# Patient Record
Sex: Male | Born: 1935 | Race: White | Hispanic: No | Smoking: Former smoker
Health system: Southern US, Community
[De-identification: ages and names within clinical notes are randomized; demographics above are authoritative.]

## PROBLEM LIST (undated history)

## (undated) DIAGNOSIS — R972 Elevated prostate specific antigen [PSA]: Secondary | ICD-10-CM

## (undated) DIAGNOSIS — N390 Urinary tract infection, site not specified: Secondary | ICD-10-CM

## (undated) DIAGNOSIS — N419 Inflammatory disease of prostate, unspecified: Secondary | ICD-10-CM

## (undated) DIAGNOSIS — N400 Enlarged prostate without lower urinary tract symptoms: Secondary | ICD-10-CM

## (undated) DIAGNOSIS — I10 Essential (primary) hypertension: Secondary | ICD-10-CM

## (undated) HISTORY — DX: Benign prostatic hyperplasia without lower urinary tract symptoms: N40.0

## (undated) HISTORY — DX: Essential (primary) hypertension: I10

## (undated) HISTORY — DX: Elevated prostate specific antigen (PSA): R97.20

## (undated) HISTORY — DX: Urinary tract infection, site not specified: N39.0

## (undated) HISTORY — DX: Inflammatory disease of prostate, unspecified: N41.9

---

## 2001-01-13 HISTORY — PX: OTHER SURGICAL HISTORY: SHX169

## 2005-11-07 ENCOUNTER — Ambulatory Visit: Payer: Self-pay | Admitting: Ophthalmology

## 2005-11-12 ENCOUNTER — Ambulatory Visit: Payer: Self-pay | Admitting: Ophthalmology

## 2010-06-22 ENCOUNTER — Observation Stay: Payer: Self-pay | Admitting: Internal Medicine

## 2012-09-30 ENCOUNTER — Ambulatory Visit: Payer: Self-pay | Admitting: Family Medicine

## 2013-12-07 DIAGNOSIS — I471 Supraventricular tachycardia: Secondary | ICD-10-CM | POA: Insufficient documentation

## 2013-12-07 DIAGNOSIS — R739 Hyperglycemia, unspecified: Secondary | ICD-10-CM | POA: Insufficient documentation

## 2013-12-07 DIAGNOSIS — I38 Endocarditis, valve unspecified: Secondary | ICD-10-CM | POA: Insufficient documentation

## 2014-05-10 ENCOUNTER — Ambulatory Visit: Payer: Self-pay | Admitting: Family Medicine

## 2015-01-04 DIAGNOSIS — I10 Essential (primary) hypertension: Secondary | ICD-10-CM | POA: Insufficient documentation

## 2015-05-26 ENCOUNTER — Encounter: Payer: Self-pay | Admitting: *Deleted

## 2015-06-14 ENCOUNTER — Ambulatory Visit (INDEPENDENT_AMBULATORY_CARE_PROVIDER_SITE_OTHER): Payer: Medicare Other | Admitting: Urology

## 2015-06-14 ENCOUNTER — Encounter: Payer: Self-pay | Admitting: Urology

## 2015-06-14 VITALS — BP 155/66 | HR 64 | Ht 67.0 in | Wt 166.2 lb

## 2015-06-14 DIAGNOSIS — N401 Enlarged prostate with lower urinary tract symptoms: Secondary | ICD-10-CM | POA: Diagnosis not present

## 2015-06-14 DIAGNOSIS — N138 Other obstructive and reflux uropathy: Secondary | ICD-10-CM | POA: Insufficient documentation

## 2015-06-14 DIAGNOSIS — N62 Hypertrophy of breast: Secondary | ICD-10-CM

## 2015-06-14 MED ORDER — DUTASTERIDE-TAMSULOSIN HCL 0.5-0.4 MG PO CAPS: 1.0000 | ORAL_CAPSULE | Freq: Every day | ORAL | Status: DC

## 2015-06-14 NOTE — Progress Notes (Signed)
06/14/2015 12:06 PM   Mario Aguilar Jan 18, 1914 [id number]  Referring provider: No referring provider defined for this encounter.  Chief Complaint  Patient presents with  . Benign Prostatic Hypertrophy    6 mth follow up    HPI: Patient is a 81 year old white male with BPH and LUTS who presents today for 6 month follow-up.  BPH WITH LUTS His IPSS score today is 4, which is mild lower urinary tract symptomatology. He is delighted with his quality life due to his urinary symptoms. His major complaint today having to take medication.   He denies any dysuria, hematuria or suprapubic pain.  He currently taking Jalyn, but he would like to discontinue the medication.  His has had TARGIS in 2002.  He also denies any recent fevers, chills, nausea or vomiting.  He does not have a family history of PCa.  He is experiencing breast tissue growth bilaterally and breast tenderness.       IPSS      06/14/15 1100       International Prostate Symptom Score   How often have you had the sensation of not emptying your bladder? Not at All     How often have you had to urinate less than every two hours? Not at All     How often have you found you stopped and started again several times when you urinated? Not at All     How often have you found it difficult to postpone urination? Less than half the time     How often have you had a weak urinary stream? Not at All     How often have you had to strain to start urination? Less than half the time     How many times did you typically get up at night to urinate? None     Total IPSS Score 4     Quality of Life due to urinary symptoms   If you were to spend the rest of your life with your urinary condition just the way it is now how would you feel about that? Delighted        Score:  1-7 Mild 8-19 Moderate 20-35 Severe  PMH: Past Medical History  Diagnosis Date  . Prostatitis   . BPH (benign prostatic hypertrophy)   . HTN (hypertension)   .  Elevated PSA   . Recurrent UTI     Surgical History: Past Surgical History  Procedure Laterality Date  . Targis  01/13/2001    Home Medications:    Medication List       This list is accurate as of: 06/14/15 12:06 PM.  Always use your most recent med list.               aspirin EC 81 MG tablet  Take 81 mg by mouth daily.     calcium-vitamin D 500-200 MG-UNIT per tablet  Commonly known as:  OSCAL WITH D  Take 1 tablet by mouth.     metoprolol succinate 25 MG 24 hr tablet  Commonly known as:  TOPROL-XL  Take 25 mg by mouth daily.        Allergies:  Allergies  Allergen Reactions  . Amoxicillin Itching  . Lisinopril Cough    Family History: Family History  Problem Relation Age of Onset  . Prostate cancer Neg Hx   . Bladder Cancer Neg Hx   . Kidney cancer Neg Hx     Social History:  reports that he has  never smoked. He does not have any smokeless tobacco history on file. He reports that he does not drink alcohol or use illicit drugs.  ROS: UROLOGY Frequent Urination?: No Hard to postpone urination?: No Burning/pain with urination?: No Get up at night to urinate?: No Leakage of urine?: No Urine stream starts and stops?: No Trouble starting stream?: No Do you have to strain to urinate?: No Blood in urine?: No Urinary tract infection?: No Sexually transmitted disease?: No Injury to kidneys or bladder?: No Painful intercourse?: No Weak stream?: No Erection problems?: No Penile pain?: No  Gastrointestinal Nausea?: No Vomiting?: No Indigestion/heartburn?: No Diarrhea?: No Constipation?: No  Constitutional Fever: No Night sweats?: No Weight loss?: No Fatigue?: No  Skin Skin rash/lesions?: No Itching?: No  Eyes Blurred vision?: No Double vision?: No  Ears/Nose/Throat Sore throat?: No Sinus problems?: No  Hematologic/Lymphatic Swollen glands?: No Easy bruising?: No  Cardiovascular Leg swelling?: No Chest pain?:  No  Respiratory Cough?: No Shortness of breath?: No  Endocrine Excessive thirst?: No  Musculoskeletal Back pain?: No Joint pain?: No  Neurological Headaches?: No Dizziness?: No  Psychologic Depression?: No Anxiety?: No  Physical Exam: BP 155/66 mmHg  Pulse 64  Ht  (1.702 m)  Wt 166 lb 3.2 oz (75.388 kg)  BMI 26.02 kg/m2  Breast:  Patient has palpable breast tissue in each breast.   GU: Patient with uncircumcised phallus. Foreskin easily retracted.  Urethral meatus is patent.  No penile discharge. No penile lesions or rashes. Scrotum without lesions, cysts, rashes and/or edema.  Testicles are located scrotally bilaterally. No masses are appreciated in the testicles. Left and right epididymis are normal. Rectal: Patient with  normal sphincter tone. Perineum without scarring or rashes. No rectal masses are appreciated. Prostate is approximately 35 grams, no nodules are appreciated. Seminal vesicles are normal.   Laboratory Data:      Most recent PSA was 2.7 ng/mL on 12/09/2014   Assessment & Plan:   1. Gynecomastia:   Patient does have breast tissue in each breast which is tender. This is a side effect of the dutasteride, but I did advise the patient and his daughters that men can have breast cancer. I did offer to schedule a mammogram, but the patient and daughters deferred at this time. He is going to discontinue the dutasteride and see if the breast tenderness improves and the breast tissue decreases.  2. BPH with LUTS:   Patient's prostate has greatly reduced with taking the Jalyn. He would like to discontinue that medication at this time. I feel that this is appropriate given his age and his very small prostate on exam. He will return in 6 months time for IPS S score and symptom recheck.  There are no diagnoses linked to this encounter.  Return in about 6 months (around 12/12/2015) for IPSS.  Michiel Cowboy, PA-C  [facility] [address] [place], [place] [address] [phone]

## 2015-06-28 ENCOUNTER — Encounter: Payer: Self-pay | Admitting: Family Medicine

## 2015-07-04 ENCOUNTER — Encounter: Payer: Self-pay | Admitting: Family Medicine

## 2015-07-04 ENCOUNTER — Ambulatory Visit (INDEPENDENT_AMBULATORY_CARE_PROVIDER_SITE_OTHER): Payer: Medicare Other | Admitting: Family Medicine

## 2015-07-04 VITALS — BP 130/62 | HR 69 | Temp 98.5°F | Resp 18 | Ht 67.0 in | Wt 164.3 lb

## 2015-07-04 DIAGNOSIS — R238 Other skin changes: Secondary | ICD-10-CM | POA: Diagnosis not present

## 2015-07-04 DIAGNOSIS — Z0000 Encounter for general adult medical examination without abnormal findings: Secondary | ICD-10-CM

## 2015-07-04 DIAGNOSIS — R233 Spontaneous ecchymoses: Secondary | ICD-10-CM

## 2015-07-04 NOTE — Progress Notes (Signed)
Name: Mario Aguilar   MRN: [id number]    DOB: 18-Apr-1914   Date:07/04/2015       Progress Note  Subjective  Chief Complaint  Chief Complaint  Patient presents with  . Annual Exam    HPI  81 year old presents for his annual H&P. His baseline problems stable he continues to see his urologist and his cardiologist  Fall Risk  07/04/2015  Falls in the past year? No   Depression screen PHQ 2/9 07/04/2015  Decreased Interest 0  Down, Depressed, Hopeless 0  PHQ - 2 Score 0   Functional Status Survey: Is the patient deaf or have difficulty hearing?: Yes Does the patient have difficulty seeing, even when wearing glasses/contacts?: No Does the patient have difficulty concentrating, remembering, or making decisions?: Yes Does the patient have difficulty walking or climbing stairs?: Yes Does the patient have difficulty dressing or bathing?: No Does the patient have difficulty doing errands alone such as visiting a doctor's office or shopping?: Yes  Past Medical History  Diagnosis Date  . Prostatitis   . BPH (benign prostatic hypertrophy)   . HTN (hypertension)   . Elevated PSA   . Recurrent UTI     Social History  Substance Use Topics  . Smoking status: Never Smoker   . Smokeless tobacco: Not on file  . Alcohol Use: No     Current outpatient prescriptions:  .  aspirin EC 81 MG tablet, Take 81 mg by mouth daily., Disp: , Rfl:  .  calcium-vitamin D (OSCAL WITH D) 500-200 MG-UNIT per tablet, Take 1 tablet by mouth., Disp: , Rfl:  .  metoprolol succinate (TOPROL-XL) 25 MG 24 hr tablet, Take 25 mg by mouth daily., Disp: , Rfl:   Allergies  Allergen Reactions  . Amoxicillin Itching  . Lisinopril Cough    Review of Systems  Constitutional: Negative for fever, chills and weight loss.  HENT: Positive for hearing loss. Negative for congestion, sore throat and tinnitus.   Eyes: Negative for blurred vision, double vision and redness.  Respiratory: Negative for cough, hemoptysis  and shortness of breath.   Cardiovascular: Positive for palpitations. Negative for chest pain, orthopnea, claudication and leg swelling.  Gastrointestinal: Negative for heartburn, nausea, vomiting, diarrhea, constipation and blood in stool.  Genitourinary: Negative for dysuria, urgency, frequency and hematuria.  Musculoskeletal: Positive for joint pain. Negative for myalgias, back pain, falls and neck pain.  Skin: Negative for itching.  Neurological: Negative for dizziness, tingling, tremors, focal weakness, seizures, loss of consciousness, weakness and headaches.  Endo/Heme/Allergies: Bruises/bleeds easily.  Psychiatric/Behavioral: Negative for depression and substance abuse. The patient is not nervous/anxious and does not have insomnia.      Objective  Filed Vitals:   07/04/15 1130  BP: 130/62  Pulse: 69  Temp: 98.5 F (36.9 C)  TempSrc: Oral  Resp: 18  Height:  (1.702 m)  Weight: 164 lb 4.8 oz (74.526 kg)  SpO2: 96%     Physical Exam  Constitutional: He is oriented to person, place, and time and well-developed, well-nourished, and in no distress.  Appears younger than his stated 89 years  HENT:  Head: Normocephalic.  Eyes: EOM are normal. Pupils are equal, round, and reactive to light.  Neck: Normal range of motion. Neck supple. No thyromegaly present.  Cardiovascular: Normal rate, regular rhythm and normal heart sounds.   No murmur heard. Pulmonary/Chest: Effort normal and breath sounds normal. No respiratory distress. He has no wheezes.  Abdominal: Soft. Bowel sounds are normal.  Genitourinary:  Per urologist  Musculoskeletal: Normal range of motion. He exhibits no edema.  Lymphadenopathy:    He has no cervical adenopathy.  Neurological: He is alert and oriented to person, place, and time. No cranial nerve deficit. Gait normal. Coordination normal.  Skin: No rash noted.  Multiple areas of senile purpura  Psychiatric: Affect and judgment normal.       Assessment & Plan   1. Annual physical exam Stable given his advanced age  51. Easy bruisability Stable

## 2015-07-17 ENCOUNTER — Emergency Department: Payer: Medicare Other

## 2015-07-17 ENCOUNTER — Emergency Department
Admission: EM | Admit: 2015-07-17 | Discharge: 2015-07-17 | Disposition: A | Discharge status: Home / Self Care | Payer: Medicare Other | Attending: Emergency Medicine | Admitting: Emergency Medicine

## 2015-07-17 ENCOUNTER — Encounter: Payer: Self-pay | Admitting: Emergency Medicine

## 2015-07-17 DIAGNOSIS — Z79899 Other long term (current) drug therapy: Secondary | ICD-10-CM | POA: Diagnosis not present

## 2015-07-17 DIAGNOSIS — I10 Essential (primary) hypertension: Secondary | ICD-10-CM | POA: Diagnosis not present

## 2015-07-17 DIAGNOSIS — B029 Zoster without complications: Secondary | ICD-10-CM | POA: Diagnosis not present

## 2015-07-17 DIAGNOSIS — R1084 Generalized abdominal pain: Secondary | ICD-10-CM | POA: Diagnosis not present

## 2015-07-17 DIAGNOSIS — Z880 Allergy status to penicillin: Secondary | ICD-10-CM | POA: Insufficient documentation

## 2015-07-17 DIAGNOSIS — Z7982 Long term (current) use of aspirin: Secondary | ICD-10-CM | POA: Diagnosis not present

## 2015-07-17 DIAGNOSIS — R109 Unspecified abdominal pain: Secondary | ICD-10-CM | POA: Diagnosis present

## 2015-07-17 LAB — LIPASE, BLOOD: Lipase: 38 U/L (ref 11–51)

## 2015-07-17 LAB — URINALYSIS COMPLETE WITH MICROSCOPIC (ARMC ONLY)
BILIRUBIN URINE: NEGATIVE
Bacteria, UA: NONE SEEN
GLUCOSE, UA: NEGATIVE mg/dL
KETONES UR: NEGATIVE mg/dL
Leukocytes, UA: NEGATIVE
NITRITE: NEGATIVE
Protein, ur: NEGATIVE mg/dL
Specific Gravity, Urine: 1.011 (ref 1.005–1.030)
pH: 5 (ref 5.0–8.0)

## 2015-07-17 LAB — CBC
HCT: 42.2 % (ref 40.0–52.0)
Hemoglobin: 14.1 g/dL (ref 13.0–18.0)
MCH: 28.9 pg (ref 26.0–34.0)
MCHC: 33.4 g/dL (ref 32.0–36.0)
MCV: 86.5 fL (ref 80.0–100.0)
PLATELETS: 200 10*3/uL (ref 150–440)
RBC: 4.88 MIL/uL (ref 4.40–5.90)
RDW: 13.1 % (ref 11.5–14.5)
WBC: 5.2 10*3/uL (ref 3.8–10.6)

## 2015-07-17 LAB — COMPREHENSIVE METABOLIC PANEL
ALK PHOS: 59 U/L (ref 38–126)
ALT: 16 U/L — AB (ref 17–63)
AST: 27 U/L (ref 15–41)
Albumin: 4.2 g/dL (ref 3.5–5.0)
Anion gap: 8 (ref 5–15)
BILIRUBIN TOTAL: 0.9 mg/dL (ref 0.3–1.2)
BUN: 32 mg/dL — ABNORMAL HIGH (ref 6–20)
CALCIUM: 9 mg/dL (ref 8.9–10.3)
CO2: 27 mmol/L (ref 22–32)
CREATININE: 1.7 mg/dL — AB (ref 0.61–1.24)
Chloride: 101 mmol/L (ref 101–111)
GFR calc non Af Amer: 32 mL/min — ABNORMAL LOW (ref >60)
GFR, EST AFRICAN AMERICAN: 37 mL/min — AB (ref >60)
GLUCOSE: 108 mg/dL — AB (ref 65–99)
Potassium: 4.2 mmol/L (ref 3.5–5.1)
SODIUM: 136 mmol/L (ref 135–145)
TOTAL PROTEIN: 7 g/dL (ref 6.5–8.1)

## 2015-07-17 MED ORDER — IOHEXOL 240 MG/ML SOLN
50.0000 mL | INTRAMUSCULAR | Status: AC
  Administered 2015-07-17: 25 mL via ORAL

## 2015-07-17 MED ORDER — VALACYCLOVIR HCL 500 MG PO TABS: 500.0000 mg | ORAL_TABLET | Freq: Two times a day (BID) | ORAL | Status: AC

## 2015-07-17 MED ORDER — VALACYCLOVIR HCL 500 MG PO TABS
500.0000 mg | ORAL_TABLET | Freq: Once | ORAL | Status: AC
  Administered 2015-07-17: 500 mg via ORAL
  Filled 2015-07-17: qty 1

## 2015-07-17 NOTE — ED Notes (Signed)
Pt presents with back and abd distention started last week. [facility] and was sent over here for further eval. Pt denies any abd pain in triage.

## 2015-07-17 NOTE — ED Notes (Signed)
abd pain and distension.  [facility]t ot [facility] and was sent here.

## 2015-07-17 NOTE — Discharge Instructions (Signed)
Shingles Shingles is an infection that causes a painful skin rash and fluid-filled blisters. Shingles is caused by the same virus that causes chickenpox. Shingles only develops in people who:  Have had chickenpox.  Have gotten the chickenpox vaccine. (This is rare.) The first symptoms of shingles may be itching, tingling, or pain in an area on your skin. A rash will follow in a few days or weeks. The rash is usually on one side of the body in a bandlike or beltlike pattern. Over time, the rash turns into fluid-filled blisters that break open, scab over, and dry up. Medicines may:  Help you manage pain.  Help you recover more quickly.  Help to prevent long-term problems. HOME CARE Medicines  Take medicines only as told by your doctor.  Apply an anti-itch or numbing cream to the affected area as told by your doctor. Blister and Rash Care  Take a cool bath or put cool compresses on the area of the rash or blisters as told by your doctor. This may help with pain and itching.  Keep your rash covered with a loose bandage (dressing). Wear loose-fitting clothing.  Keep your rash and blisters clean with mild soap and cool water or as told by your doctor.  Check your rash every day for signs of infection. These include redness, swelling, and pain that lasts or gets worse.  Do not pick your blisters.  Do not scratch your rash. General Instructions  Rest as told by your doctor.  Keep all follow-up visits as told by your doctor. This is important.  Until your blisters scab over, your infection can cause chickenpox in people who have never had it or been vaccinated against it. To prevent this from happening, avoid touching other people or being around other people, especially:  Babies.  Pregnant women.  Children who have eczema.  Elderly people who have transplants.  People who have chronic illnesses, such as leukemia or AIDS. GET HELP IF:  Your pain does not get better with  medicine.  Your pain does not get better after the rash heals.  Your rash looks infected. Signs of infection include:  Redness.  Swelling.  Pain that lasts or gets worse. GET HELP RIGHT AWAY IF:  The rash is on your face or nose.  You have pain in your face, pain around your eye area, or loss of feeling on one side of your face.  You have ear pain or you have ringing in your ear.  You have loss of taste.  Your condition gets worse.   This information is not intended to replace advice given to you by your health care provider. Make sure you discuss any questions you have with your health care provider.   Document Released: 02/26/2008 Document Revised: 09/30/2014 Document Reviewed: 06/21/2014 Elsevier Interactive Patient Education 2016 [employer]  

## 2015-07-17 NOTE — ED Provider Notes (Signed)
[facility] Emergency Department Provider Note  Time seen: 4:01 PM  I have reviewed the triage vital signs and the nursing notes.   HISTORY  Chief Complaint Abdominal Pain and Back Pain    HPI Mario Aguilar is a 81 y.o. male with a past medical history of BPH, hypertension, presents the emergency department with abdominal and back pain. According to the patient and his family he was complaining of right-sided back pain one week ago, which has progressed to now burning and itching in the area. Over the last 1-2 days he's also complained of some right-sided abdominal pain. He went to urgent care today and they sent him to the emergency department for further evaluation. They noted that his abdomen appeared distended. Patient notes normal bowel movements including a normal bowel movement yesterday. Denies nausea or vomiting. Denies fever. Has noted the area in the back of the last 1-2 days has started forming a rash with small lesions. Denies fever.Denies any current pain at this moment. States his back and abdominal pain was moderate, but is currently gone.    Past Medical History  Diagnosis Date  . Prostatitis   . BPH (benign prostatic hypertrophy)   . HTN (hypertension)   . Elevated PSA   . Recurrent UTI     Patient Active Problem List   Diagnosis Date Noted  . Gynecomastia 06/14/2015  . 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 Benign essential HTN 01/04/2015  . 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 Supraventricular tachycardia (HCC) 12/07/2013  . Heart valve disease 12/07/2013    Past Surgical History  Procedure Laterality Date  . Targis  01/13/2001  . No past surgeries      Current Outpatient Rx  Name  Route  Sig  Dispense  Refill  . aspirin EC 81 MG tablet   Oral   Take 81 mg by mouth daily.         . calcium-vitamin D (OSCAL WITH D) 500-200 MG-UNIT per tablet   Oral   Take 1 tablet by  mouth.         . metoprolol succinate (TOPROL-XL) 25 MG 24 hr tablet   Oral   Take 25 mg by mouth daily.           Allergies Amoxicillin; Diphenhydramine; and Lisinopril  Family History  Problem Relation Age of Onset  . Prostate cancer Neg Hx   . Bladder Cancer Neg Hx   . Kidney cancer Neg Hx     Social History Social History  Substance Use Topics  . Smoking status: Never Smoker   . Smokeless tobacco: None  . Alcohol Use: No    Review of Systems Constitutional: Negative for fever Cardiovascular: Negative for chest pain. Respiratory: Negative for shortness of breath. Gastrointestinal: Right-sided abdominal pain, currently gone. Denies nausea, vomiting, diarrhea. Denies constipation. Genitourinary: Negative for dysuria. Musculoskeletal: Back pain/itching. Skin: Rash to right sided back. Neurological: Negative for headache 10-point ROS otherwise negative.  ____________________________________________   PHYSICAL EXAM:  VITAL SIGNS: ED Triage Vitals  Enc Vitals Group     BP 07/17/15 1348 194/78 mmHg     Pulse Rate 07/17/15 1348 68     Resp 07/17/15 1348 20     Temp 07/17/15 1348 98.2 F (36.8 C)     Temp Source 07/17/15 1348 Oral     SpO2 07/17/15 1348 98 %     Weight 07/17/15 1348 160 lb (72.576 kg)  Height 07/17/15 1348 5\' 7"  (1.702 m)     Head Cir --      Peak Flow --      Pain Score --      Pain Loc --      Pain Edu? --      Excl. in GC? --     Constitutional: Alert and oriented. Well appearing and in no distress. Eyes: Normal exam ENT   Head: Normocephalic and atraumatic.   Mouth/Throat: Mucous membranes are moist. Cardiovascular: Normal rate, regular rhythm. No murmur Respiratory: Normal respiratory effort without tachypnea nor retractions. Breath sounds are clear  Gastrointestinal: Soft and nontender. Mild distention, tympanic percussion. Musculoskeletal: Nontender with normal range of motion in all extremities.  Neurologic:   Normal speech and language. No gross focal neurologic deficits Skin:  Right mid to lower back rash, vesicles, mild erythema. Psychiatric: Mood and affect are normal. Speech and behavior are normal. Patient exhibits appropriate insight and judgment.  ____________________________________________   INITIAL IMPRESSION / ASSESSMENT AND PLAN / ED COURSE  Pertinent labs & imaging results that were available during my care of the patient were reviewed by me and considered in my medical decision making (see chart for details).  Patient presents the emergency department right-sided back and abdominal pain. Exam is most consistent with likely zoster. Patient has several open blisters to the right back, and his main complaint is itching to this area. It appears to be in a dermatomal pattern, does not cross the midline. There is no rash in the abdomen, the abdomen does appear mildly distended with tympanic percussion. We'll proceed with a CT scan to rule out intra-abdominal pathology/obstruction. Regardless the patient will be treated for herpes zoster with a seven-day course of valacyclovir given new lesions within the past 1-2 days.  CT abdomen/pelvis does not show any acute/concerning abnormalities. Incidental findings discussed with the patient, no history of pancreatitis. I provided a printed copy of the CT report, family will be taking it to his primary care physician for further workup if deemed appropriate. We will start the patient on Valtrex for likely herpes zoster. Patient to follow-up with his primary care physician.  ____________________________________________   FINAL CLINICAL IMPRESSION(S) / ED DIAGNOSES  Herpes zoster Abdominal pain   Minna AntisKevin Sirron Francesconi, MD 07/17/15 1904

## 2015-07-19 ENCOUNTER — Telehealth: Payer: Self-pay | Admitting: Family Medicine

## 2015-07-19 NOTE — Telephone Encounter (Signed)
Jasmine DecemberSharon (daughter) [facility]. He was diagnosed with shingles and was prescribed Valtrax and is taking tylenol alternating it with ibuprofen. Patient is in severe pain and is not able to sleep at night. Is there anything else he can take to help with the pain. If possible please send prescription to [facility] [address]

## 2015-07-20 NOTE — Telephone Encounter (Signed)
For moderate to severe pain associated with herpes zoster, we will recommend tramadol 50 mg every 6 hours as needed.

## 2015-07-21 MED ORDER — TRAMADOL HCL 50 MG PO TABS: 50.0000 mg | ORAL_TABLET | Freq: Four times a day (QID) | ORAL | Status: DC | PRN

## 2015-07-21 NOTE — Telephone Encounter (Signed)
Informed pt daughter and she said that her father has been controlling pain with alternating  Valtrex,tylenol and ibuprofen. Told her I would send medication to the pharmacy just in case anything changed over the weekend and so that it would be available.

## 2015-08-22 ENCOUNTER — Ambulatory Visit (INDEPENDENT_AMBULATORY_CARE_PROVIDER_SITE_OTHER): Payer: Medicare Other | Admitting: Family Medicine

## 2015-08-22 ENCOUNTER — Ambulatory Visit: Payer: Medicare Other | Admitting: Family Medicine

## 2015-08-22 ENCOUNTER — Encounter: Payer: Self-pay | Admitting: Family Medicine

## 2015-08-22 VITALS — BP 142/64 | HR 74 | Temp 98.8°F | Resp 16 | Ht 67.0 in | Wt 160.0 lb

## 2015-08-22 DIAGNOSIS — B029 Zoster without complications: Secondary | ICD-10-CM | POA: Diagnosis not present

## 2015-08-22 MED ORDER — TRIAMCINOLONE ACETONIDE 0.1 % EX CREA: 1.0000 "application " | TOPICAL_CREAM | Freq: Two times a day (BID) | CUTANEOUS | Status: DC

## 2015-08-22 MED ORDER — VALACYCLOVIR HCL 1 G PO TABS: 1000.0000 mg | ORAL_TABLET | Freq: Three times a day (TID) | ORAL | Status: DC

## 2015-08-22 MED ORDER — PREDNISONE 5 MG (48) PO TBPK: 5.0000 mg | ORAL_TABLET | Freq: Every day | ORAL | Status: DC

## 2015-08-22 NOTE — Progress Notes (Signed)
Name: Mario Aguilar   MRN: [id number]    DOB: May 14, 1914   Date:08/22/2015       Progress Note  Subjective  Chief Complaint  Chief Complaint  Patient presents with  . Herpes Zoster    flare-up from October, still having pain and itching    HPI  Shingles: he had an episode in October, seen at [facility], took Valtrex and symptoms improved, however over the past 10 days symptoms are flaring again with severe pain on the same area ( T 10 distribution ). Initially the rash was on abdominal area, but pain on his back. He is currently taking Tylenol, Ibuprofen and using Calamine lotion, only improves the pain for about 10 minutes. Pain is described as sharp, itchy, uncomfortable. Affecting his sleep , but still has a good appetite.   Patient Active Problem List   Diagnosis Date Noted  . Shingles 08/22/2015  . Gynecomastia 06/14/2015  . 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 Benign essential HTN 01/04/2015  . 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 Supraventricular tachycardia ([place]) 12/07/2013  . Heart valve disease 12/07/2013    Past Surgical History  Procedure Laterality Date  . Targis  01/13/2001  . No past surgeries      Family History  Problem Relation Age of Onset  . Prostate cancer Neg Hx   . Bladder Cancer Neg Hx   . Kidney cancer Neg Hx     Social History   Social History  . Marital Status: Married    Spouse Name: N/A  . Number of Children: N/A  . Years of Education: N/A   Occupational History  . Not on file.   Social History Main Topics  . Smoking status: Never Smoker   . Smokeless tobacco: Not on file  . Alcohol Use: No  . Drug Use: No  . Sexual Activity: Not on file   Other Topics Concern  . Not on file   Social History Narrative     Current outpatient prescriptions:  .  aspirin EC 81 MG tablet, Take 81 mg by mouth daily., Disp: , Rfl:  .  calcium-vitamin D (OSCAL WITH D) 500-200 MG-UNIT per tablet, Take  1 tablet by mouth., Disp: , Rfl:  .  metoprolol succinate (TOPROL-XL) 25 MG 24 hr tablet, Take 25 mg by mouth daily., Disp: , Rfl:  .  predniSONE (STERAPRED UNI-PAK 48 TAB) 5 MG (48) TBPK tablet, Take 1 tablet (5 mg total) by mouth daily. With food, Disp: 48 tablet, Rfl: 0 .  triamcinolone cream (KENALOG) 0.1 %, Apply 1 application topically 2 (two) times daily. Mixed with calamine lotion, Disp: 45 g, Rfl: 0 .  valACYclovir (VALTREX) 1000 MG tablet, Take 1 tablet (1,000 mg total) by mouth 3 (three) times daily., Disp: 30 tablet, Rfl: 0  Allergies  Allergen Reactions  . Amoxicillin Itching  . Diphenhydramine   . Lisinopril Cough     ROS  Ten systems reviewed and is negative except as mentioned in HPI. He has episodes of legs feeling weak and daughter is worried about medication that can cause sedation   Objective  Filed Vitals:   08/22/15 1146  BP: 142/64  Pulse: 74  Temp: 98.8 F (37.1 C)  TempSrc: Oral  Resp: 16  Height: 5' 7"  (1.702 m)  Weight: 160 lb (72.576 kg)  SpO2: 97%    Body mass index is 25.05 kg/(m^2).  Physical Exam  Constitutional: Patient appears well-developed and well-nourished. No distress.  HEENT: head atraumatic, normocephalic, hearing aids,  neck supple, throat within normal limits Cardiovascular: Normal rate, regular rhythm and normal heart sounds. No BLE edema. Pulmonary/Chest: Effort normal and breath sounds normal. No respiratory distress. Abdominal: Soft.  There is no tenderness. Psychiatric: quite, cooperative, no focal defict Skin: dry cracked skin on T10 distribution on his back, skin is red, but no signs of infection, no blisters.   Recent Results (from the past 2160 hour(s))  Lipase, blood     Status: None   Collection Time: 07/17/15  1:56 PM  Result Value Ref Range   Lipase 38 11 - 51 U/L    Comment: Please note change in reference range.  Comprehensive metabolic panel     Status: Abnormal   Collection Time: 07/17/15  1:56 PM  Result  Value Ref Range   Sodium 136 135 - 145 mmol/L   Potassium 4.2 3.5 - 5.1 mmol/L   Chloride 101 101 - 111 mmol/L   CO2 27 22 - 32 mmol/L   Glucose, Bld 108 (H) 65 - 99 mg/dL   BUN 32 (H) 6 - 20 mg/dL   Creatinine, Ser 1.70 (H) 0.61 - 1.24 mg/dL   Calcium 9.0 8.9 - 10.3 mg/dL   Total Protein 7.0 6.5 - 8.1 g/dL   Albumin 4.2 3.5 - 5.0 g/dL   AST 27 15 - 41 U/L   ALT 16 (L) 17 - 63 U/L   Alkaline Phosphatase 59 38 - 126 U/L   Total Bilirubin 0.9 0.3 - 1.2 mg/dL   GFR calc non Af Amer 32 (L) >60 mL/min   GFR calc Af Amer 37 (L) >60 mL/min    Comment: (NOTE) The eGFR has been calculated using the CKD EPI equation. This calculation has not been validated in all clinical situations. eGFR's persistently <60 mL/min signify possible Chronic Kidney Disease.    Anion gap 8 5 - 15  CBC     Status: None   Collection Time: 07/17/15  1:56 PM  Result Value Ref Range   WBC 5.2 3.8 - 10.6 K/uL   RBC 4.88 4.40 - 5.90 MIL/uL   Hemoglobin 14.1 13.0 - 18.0 g/dL   HCT 42.2 40.0 - 52.0 %   MCV 86.5 80.0 - 100.0 fL   MCH 28.9 26.0 - 34.0 pg   MCHC 33.4 32.0 - 36.0 g/dL   RDW 13.1 11.5 - 14.5 %   Platelets 200 150 - 440 K/uL  Urinalysis complete, with microscopic ([facility] only)     Status: Abnormal   Collection Time: 07/17/15  3:59 PM  Result Value Ref Range   Color, Urine YELLOW (A) YELLOW   APPearance CLEAR (A) CLEAR   Glucose, UA NEGATIVE NEGATIVE mg/dL   Bilirubin Urine NEGATIVE NEGATIVE   Ketones, ur NEGATIVE NEGATIVE mg/dL   Specific Gravity, Urine 1.011 1.005 - 1.030   Hgb urine dipstick 1+ (A) NEGATIVE   pH 5.0 5.0 - 8.0   Protein, ur NEGATIVE NEGATIVE mg/dL   Nitrite NEGATIVE NEGATIVE   Leukocytes, UA NEGATIVE NEGATIVE   RBC / HPF 0-5 0 - 5 RBC/hpf   WBC, UA 0-5 0 - 5 WBC/hpf   Bacteria, UA NONE SEEN NONE SEEN   Squamous Epithelial / LPF 0-5 (A) NONE SEEN   Mucous PRESENT      PHQ2/9: Depression screen [facility] 2/9 08/22/2015 07/04/2015  Decreased Interest 0 0  Down, Depressed,  Hopeless 0 0  PHQ - 2 Score 0 0    Fall Risk: Fall Risk  08/22/2015 07/04/2015  Falls in the past year? No No    Functional Status Survey: Is the patient deaf or have difficulty hearing?: No Does the patient have difficulty seeing, even when wearing glasses/contacts?: Yes (glasses) Does the patient have difficulty concentrating, remembering, or making decisions?: Yes Does the patient have difficulty walking or climbing stairs?: Yes Does the patient have difficulty dressing or bathing?: Yes Does the patient have difficulty doing errands alone such as visiting a doctor's office or shopping?: Yes   Assessment & Plan  1. Shingles  Recurrence, versus post-herpetic neuralgia, we will try prednisone ( off label - patient and daughter aware ) discussed importance of taking it with food to decrease risk of GI bleed. Valtrex, and since skin is so dry we will try topical steroids. Return in 2 weeks if still having pain to discuss Nortriptyline use with Dr. Rutherford Nail. Stop otc ibuprofen but can continue Tylenol otc - predniSONE (STERAPRED UNI-PAK 48 TAB) 5 MG (48) TBPK tablet; Take 1 tablet (5 mg total) by mouth daily. With food  Dispense: 48 tablet; Refill: 0 - valACYclovir (VALTREX) 1000 MG tablet; Take 1 tablet (1,000 mg total) by mouth 3 (three) times daily.  Dispense: 30 tablet; Refill: 0 - triamcinolone cream (KENALOG) 0.1 %; Apply 1 application topically 2 (two) times daily. Mixed with calamine lotion  Dispense: 45 g; Refill: 0

## 2015-12-12 ENCOUNTER — Ambulatory Visit (INDEPENDENT_AMBULATORY_CARE_PROVIDER_SITE_OTHER): Payer: Medicare Other | Admitting: Urology

## 2015-12-12 ENCOUNTER — Encounter: Payer: Self-pay | Admitting: Urology

## 2015-12-12 VITALS — BP 161/68 | HR 78 | Ht 67.0 in | Wt 165.4 lb

## 2015-12-12 DIAGNOSIS — N401 Enlarged prostate with lower urinary tract symptoms: Secondary | ICD-10-CM

## 2015-12-12 DIAGNOSIS — N138 Other obstructive and reflux uropathy: Secondary | ICD-10-CM

## 2015-12-12 DIAGNOSIS — N62 Hypertrophy of breast: Secondary | ICD-10-CM

## 2015-12-12 NOTE — Progress Notes (Signed)
10:43 AM   Mario Aguilar Jul 15, 1914 [id number]  Referring provider: Dennison Mascot, MD [address] [place], [place] [address]  Chief Complaint  Patient presents with  . Benign Prostatic Hypertrophy    6 month follow up    HPI: Patient is a 82 year old Caucasian male with bilateral breast tenderness and  BPH and LUTS who presents today for 6 month follow-up.  BPH WITH LUTS His IPSS score today is , which is mild lower urinary tract symptomatology. He is delighted with his quality life due to his urinary symptoms. His major complaint today having to take medication.   He denies any dysuria, hematuria or suprapubic pain.  He discontinued the Jalyn at his last visit with [place].   His has had TARGIS in 2002.  He also denies any recent fevers, chills, nausea or vomiting.  He does not have a family history of PCa.         IPSS      12/12/15 1000       International Prostate Symptom Score   How often have you had the sensation of not emptying your bladder? Not at All     How often have you had to urinate less than every two hours? Not at All     How often have you found you stopped and started again several times when you urinated? Not at All     How often have you found it difficult to postpone urination? Not at All     How often have you had a weak urinary stream? Less than 1 in 5 times     How often have you had to strain to start urination? Not at All     How many times did you typically get up at night to urinate? 1 Time     Total IPSS Score 2     Quality of Life due to urinary symptoms   If you were to spend the rest of your life with your urinary condition just the way it is now how would you feel about that? Delighted        Score:  1-7 Mild 8-19 Moderate 20-35 Severe  Breast tenderness His breast tissue is less tender since he has discontinued his dutasteride.    PMH: Past Medical History  Diagnosis Date  . Prostatitis   . BPH (benign prostatic  hypertrophy)   . HTN (hypertension)   . Elevated PSA   . Recurrent UTI     Surgical History: Past Surgical History  Procedure Laterality Date  . Targis  01/13/2001    Home Medications:    Medication List       This list is accurate as of: 12/12/15 10:43 AM.  Always use your most recent med list.               amLODipine 5 MG tablet  Commonly known as:  NORVASC  TAKE 1 TABLET (5 MG TOTAL) BY MOUTH ONCE DAILY.     aspirin EC 81 MG tablet  Take 81 mg by mouth daily.     calcium-vitamin D 500-200 MG-UNIT tablet  Commonly known as:  OSCAL WITH D  Take 1 tablet by mouth.     metoprolol succinate 25 MG 24 hr tablet  Commonly known as:  TOPROL-XL  Take 25 mg by mouth daily. Reported on 12/12/2015     metoprolol tartrate 25 MG tablet  Commonly known as:  LOPRESSOR     predniSONE 5 MG (  48) Tbpk tablet  Commonly known as:  STERAPRED UNI-PAK 48 TAB  Take 1 tablet (5 mg total) by mouth daily. With food     triamcinolone cream 0.1 %  Commonly known as:  KENALOG  Apply 1 application topically 2 (two) times daily. Mixed with calamine lotion     valACYclovir 1000 MG tablet  Commonly known as:  VALTREX  Take 1 tablet (1,000 mg total) by mouth 3 (three) times daily.        Allergies:  Allergies  Allergen Reactions  . Amoxicillin Itching  . Diphenhydramine   . Lisinopril Cough    Family History: Family History  Problem Relation Age of Onset  . Prostate cancer Neg Hx   . Bladder Cancer Neg Hx   . Kidney cancer Neg Hx     Social History:  reports that he has never smoked. He does not have any smokeless tobacco history on file. He reports that he does not drink alcohol or use illicit drugs.  ROS: UROLOGY Frequent Urination?: No Hard to postpone urination?: No Burning/pain with urination?: No Get up at night to urinate?: No Leakage of urine?: No Urine stream starts and stops?: No Trouble starting stream?: No Do you have to strain to urinate?: No Blood in  urine?: No Urinary tract infection?: No Sexually transmitted disease?: No Injury to kidneys or bladder?: No Painful intercourse?: No Weak stream?: No Erection problems?: No Penile pain?: No  Gastrointestinal Nausea?: No Vomiting?: No Indigestion/heartburn?: No Diarrhea?: No Constipation?: No  Constitutional Fever: No Night sweats?: No Weight loss?: No Fatigue?: No  Skin Skin rash/lesions?: No Itching?: Yes  Eyes Blurred vision?: No Double vision?: No  Ears/Nose/Throat Sore throat?: No Sinus problems?: No  Hematologic/Lymphatic Swollen glands?: No Easy bruising?: Yes  Cardiovascular Leg swelling?: No Chest pain?: No  Respiratory Cough?: Yes Shortness of breath?: No  Endocrine Excessive thirst?: No  Musculoskeletal Back pain?: No Joint pain?: No  Neurological Headaches?: No Dizziness?: No  Psychologic Depression?: No Anxiety?: No  Physical Exam: BP 161/68 mmHg  Pulse 78  Ht 5\' 7"  (1.702 m)  Wt 165 lb 6.4 oz (75.025 kg)  BMI 25.90 kg/m2  Breast:  Patient has palpable breast tissue in each breast.  Non tender.  GU: Patient with uncircumcised phallus. Foreskin easily retracted.  Urethral meatus is patent.  No penile discharge. No penile lesions or rashes. Scrotum without lesions, cysts, rashes and/or edema.  Testicles are located scrotally bilaterally. No masses are appreciated in the testicles. Left and right epididymis are normal. Rectal: Patient with  normal sphincter tone. Perineum without scarring or rashes. No rectal masses are appreciated. Prostate is approximately 35 grams, no nodules are appreciated. Seminal vesicles are normal.   Laboratory Data:      Most recent PSA was 2.7 ng/mL on 12/09/2014   Assessment & Plan:   1. Gynecomastia:   Patient does have breast tissue in each breast which has reduced since discontinuing the dutasteride.  The pain has greatly  diminshed since he stopped the dutasteride.  We will continue to monitor.  He  will RTC in 6 months.  2. BPH with LUTS:   IPSS score is 2/0 without the Jalyn.  We will continue to monitor.  He will have his IPSS score and exam and in 6 months.  Return in about 6 months (around 06/13/2016) for IPSS score and exam.  Michiel CowboySHANNON Draya Felker, [facility]  [facility] [address], [address] [place], [place] [address] [phone]

## 2016-01-03 ENCOUNTER — Ambulatory Visit: Payer: Medicare Other | Admitting: Family Medicine

## 2016-01-06 IMAGING — CT CT ABD-PELV W/O CM
1 of 2 series · 14 of 32 positions shown, 18 images · non-contrast
Comparison: Lumbar spine radiographs 05/10/2014

CLINICAL DATA: Patient with back pain and abdominal distention for
1 week. Renal insufficiency.

EXAM:
CT ABDOMEN AND PELVIS WITHOUT CONTRAST
TECHNIQUE: Multidetector CT imaging of the abdomen and pelvis was performed
following the standard protocol without IV contrast.

[Series 2: routine abd pel without · axial · non-contrast · 0.68mm/px · z∈[-428,-32]mm · 14 of 90 slices shown, 18 images]
[im 7/90  soft-tissue]
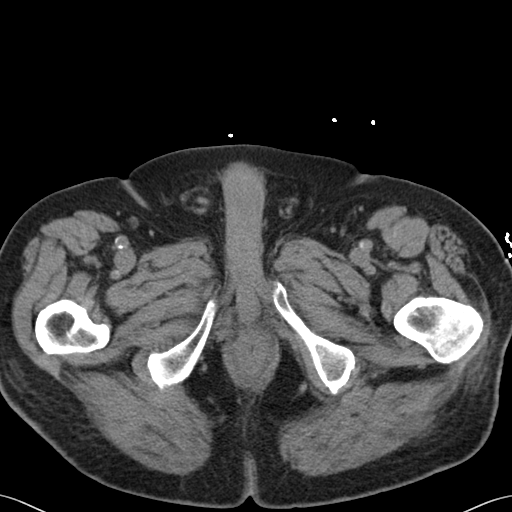
[im 7/90  bone]
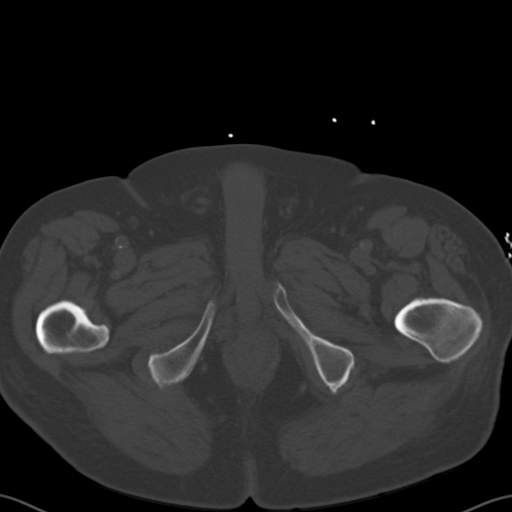
[im 14/90  soft-tissue]
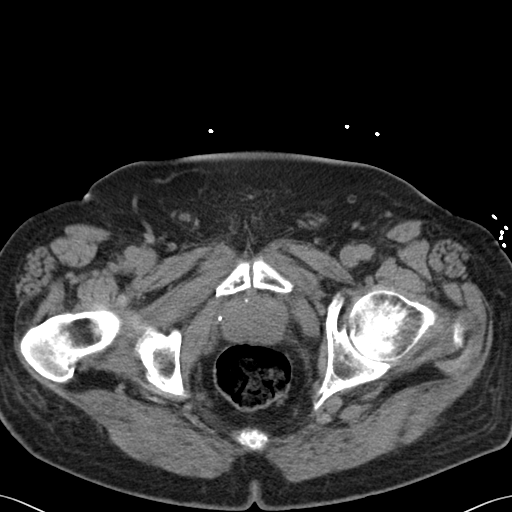
[im 21/90  soft-tissue]
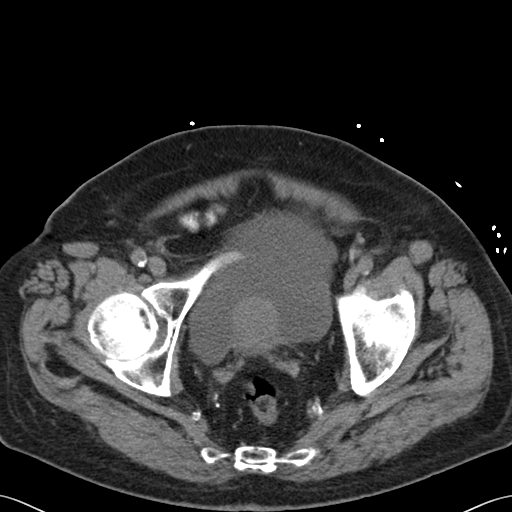
[im 28/90  soft-tissue]
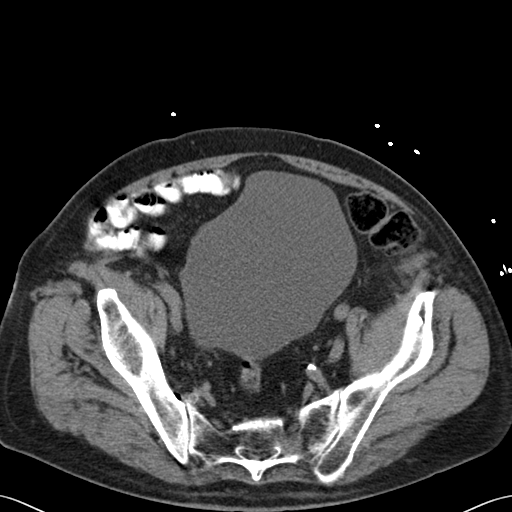
[im 35/90  soft-tissue]
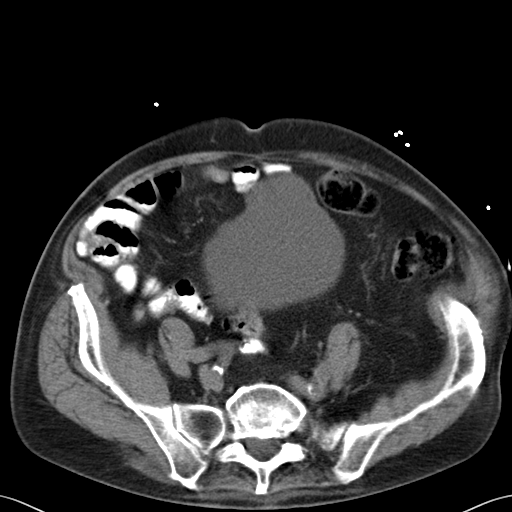
[im 42/90  soft-tissue]
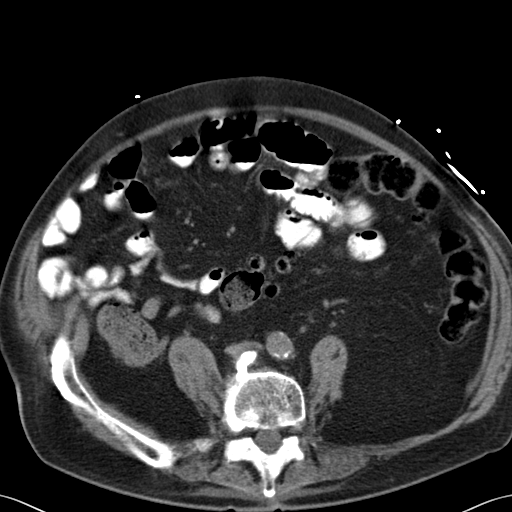
[im 48/90  soft-tissue]
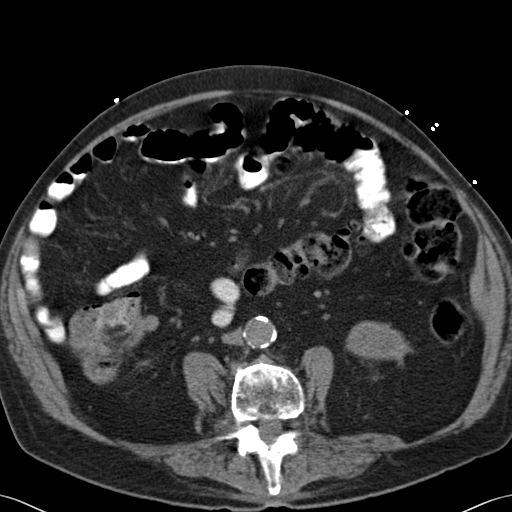
[im 55/90  soft-tissue]
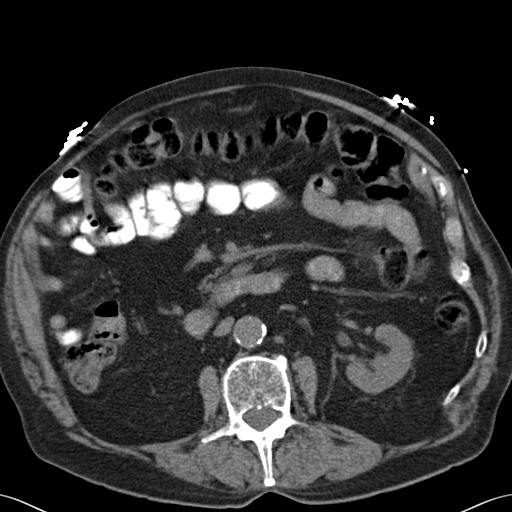
[im 62/90  soft-tissue]
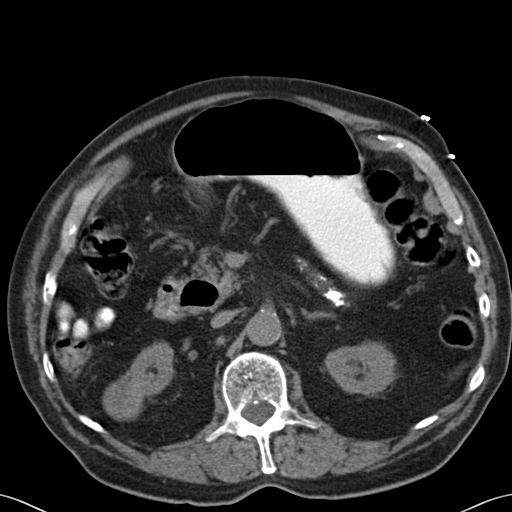
[im 62/90  bone]
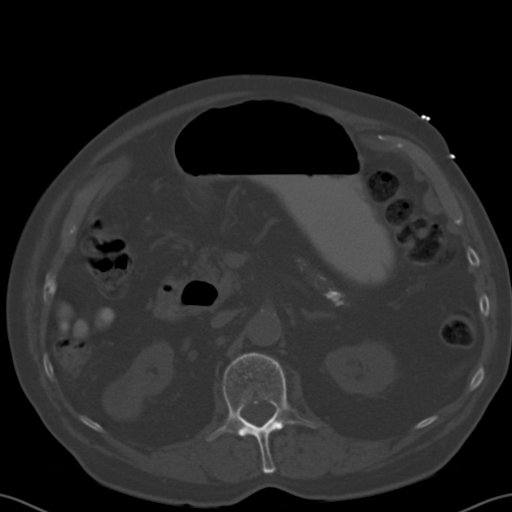
[im 69/90  soft-tissue]
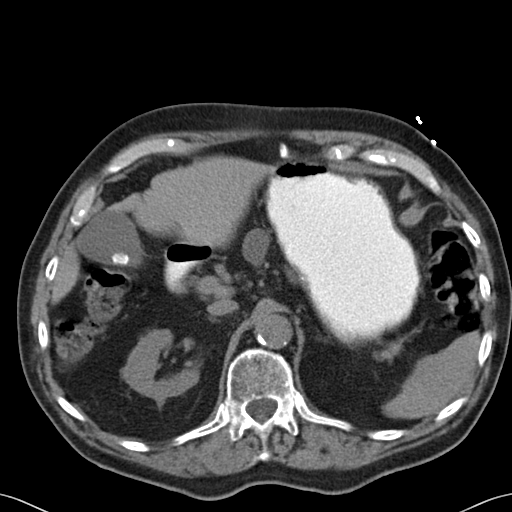
[im 76/90  soft-tissue]
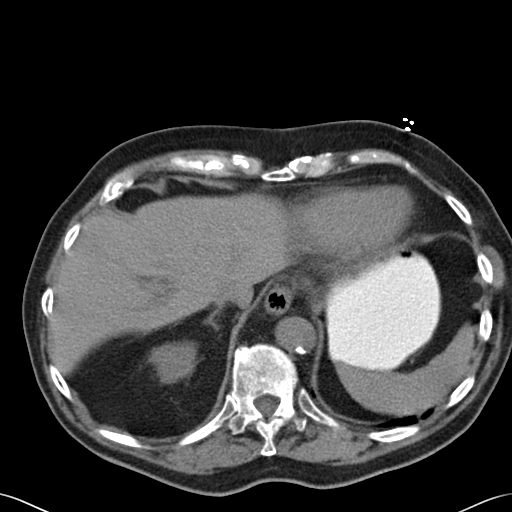
[im 76/90  lung]
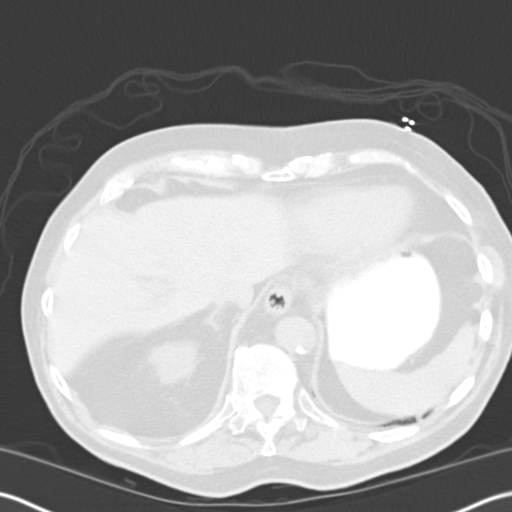
[im 79/90  lung]
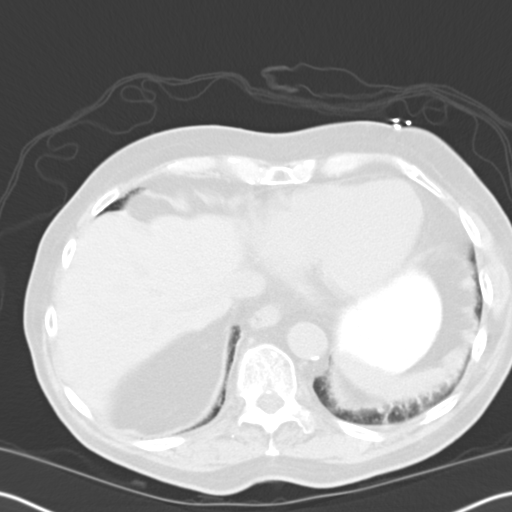
[im 83/90  soft-tissue]
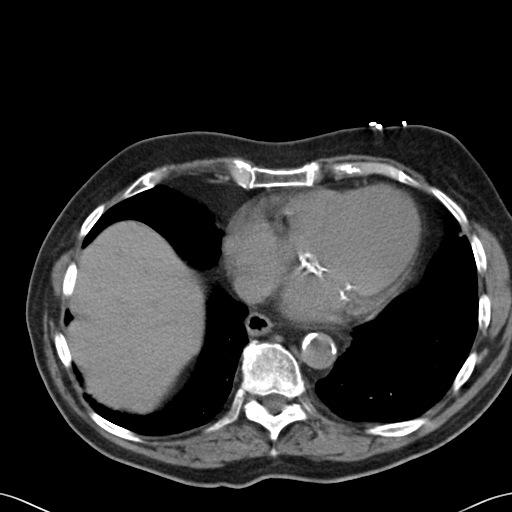
[im 83/90  lung]
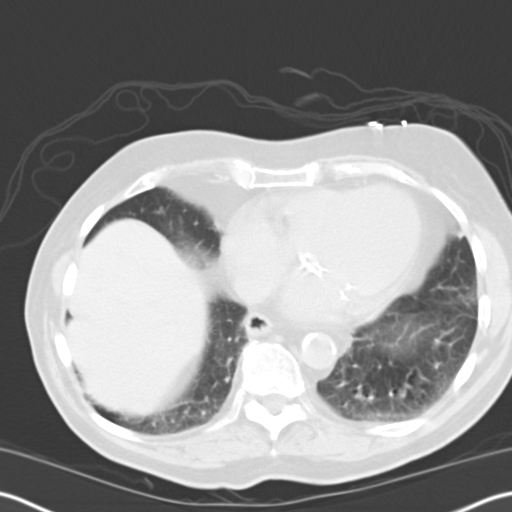
[im 86/90  lung]
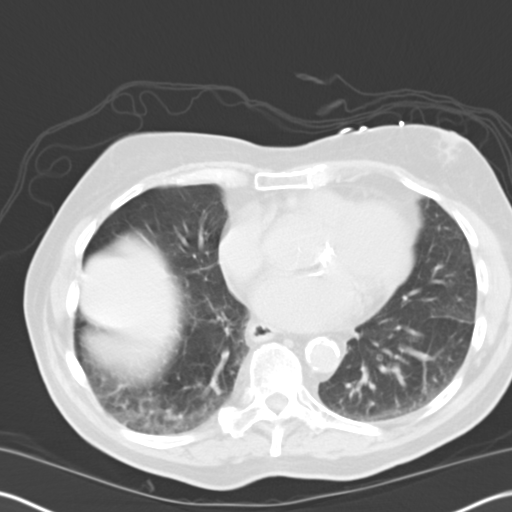

[14 of 32 positions shown; findings below may reference images not displayed]

FINDINGS: Lower chest: Bilateral gynecomastia. Normal heart size. Coronary
arterial vascular calcifications. Small hiatal hernia. Subpleural
consolidative opacities likely represent atelectasis and or
scarring. No pleural effusion.

Hepatobiliary: The liver is unremarkable. Calcified stone within the
gallbladder lumen. No gallbladder wall thickening. No intrahepatic
or extrahepatic biliary ductal dilatation.

Pancreas: There is 2.6 cm cystic lesion within the pancreatic body
with upstream dilatation of main pancreatic duct. There is
peripheral calcification surrounding the dilated duct. Associated
pancreatic parenchymal atrophy. Pancreatic head and uncinate process
are grossly unremarkable.

Spleen: Unremarkable

Adrenals/Urinary Tract: The adrenal glands are normal. Kidneys are
symmetric in size. There is 3.3 cm exophytic lesion off the
interpolar region of the right kidney with an internal density of 25
Hounsfield units (image 27; series 2). No hydronephrosis. Urinary
bladder is unremarkable.

Stomach/Bowel: Sigmoid colonic diverticulosis. No CT evidence for
acute diverticulitis. The appendix is normal. No abnormal bowel wall
thickening or evidence for bowel obstruction. The stomach is normal.

Vascular/Lymphatic: Normal caliber abdominal aorta. Peripheral
calcified atherosclerotic plaque. No retroperitoneal
lymphadenopathy.

Other: Prostate is unremarkable. Soft tissue within the right
inguinal canal likely represents retracted testicle. Fat containing
left inguinal hernia.

Musculoskeletal: Lower lumbar spine degenerative changes. No
aggressive or acute appearing osseous lesions.
IMPRESSION: No acute process within the abdomen or pelvis.

There is a 2.6 cm cystic lesion within the mid body of the pancreas.
There is associated upstream pancreatic parenchymal atrophy and
ductal dilatation with peripheral calcification. If the patient has
a history of known chronic pancreatitis, findings are likely
representative of a pseudocyst. If there is no clinical history of
chronic pancreatitis, consider dedicated evaluation of the pancreas
with pre and post contrast-enhanced CT or MRI.

Indeterminate exophytic lesion off of interpolar region of the right
kidney. If additional cross-sectional imaging is obtained to
evaluate the pancreas, this can be evaluated at the same time.
Otherwise, ultrasound is recommended as an initial next step as a
solid renal lesion is not excluded.

Cholelithiasis.

## 2016-03-04 ENCOUNTER — Encounter: Payer: Self-pay | Admitting: Family Medicine

## 2016-03-04 ENCOUNTER — Ambulatory Visit (INDEPENDENT_AMBULATORY_CARE_PROVIDER_SITE_OTHER): Payer: Medicare Other | Admitting: Family Medicine

## 2016-03-04 VITALS — BP 128/60 | HR 79 | Temp 98.2°F | Resp 18 | Ht 67.0 in | Wt 166.1 lb

## 2016-03-04 DIAGNOSIS — N401 Enlarged prostate with lower urinary tract symptoms: Secondary | ICD-10-CM

## 2016-03-04 DIAGNOSIS — I10 Essential (primary) hypertension: Secondary | ICD-10-CM

## 2016-03-04 DIAGNOSIS — I471 Supraventricular tachycardia: Secondary | ICD-10-CM | POA: Diagnosis not present

## 2016-03-04 DIAGNOSIS — N183 Chronic kidney disease, stage 3 unspecified: Secondary | ICD-10-CM

## 2016-03-04 DIAGNOSIS — N138 Other obstructive and reflux uropathy: Secondary | ICD-10-CM

## 2016-03-04 MED ORDER — METOPROLOL SUCCINATE ER 50 MG PO TB24: 50.0000 mg | ORAL_TABLET | Freq: Every day | ORAL | Status: DC

## 2016-03-04 NOTE — Progress Notes (Signed)
Date:  03/04/2016   Name:  Mario Aguilar   DOB:  21-May-1914   MRN:  [id number]  PCP:  Ashok Norris, MD    Chief Complaint: Medication Refill   History of Present Illness:  This is a 82 y.o. male with HTN and PSVT well controlled on metoprolol. Was on amlodipine but stopped due to side effects. Sees cardiologist twice yearly, no changes made last visit in April. BP's 143-171/67-75 at home. C/o nocturia x 2-3, has had prostate procedure, sees urology, developed gynecomastia with Avodart so stopped, monitoring nocturia. No clear indication for asa therapy. Last blood work in October showed eGFR 32. On ca++/vit D supplement, no recent vit D level noted. Intermittent nonpainful RLE edema, improves overnight.  Review of Systems:  Review of Systems  Constitutional: Negative for fever.  Respiratory: Negative for cough and shortness of breath.   Cardiovascular: Negative for chest pain.    Patient Active Problem List   Diagnosis Date Noted  . Shingles 08/22/2015  . Gynecomastia 06/14/2015  . 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 Benign essential HTN 01/04/2015  . 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 Supraventricular tachycardia ([place]) 12/07/2013  . Heart valve disease 12/07/2013    Prior to Admission medications   Medication Sig Start Date End Date Taking? Authorizing Provider  calcium-vitamin D (OSCAL WITH D) 500-200 MG-UNIT per tablet Take 1 tablet by mouth.    Historical Provider, MD  metoprolol succinate (TOPROL-XL) 50 MG 24 hr tablet Take 1 tablet (50 mg total) by mouth daily. 03/04/16   Adline Potter, MD    Allergies  Allergen Reactions  . Amoxicillin Itching  . Diphenhydramine   . Lisinopril Cough  . Amlodipine Swelling and Rash    Past Surgical History  Procedure Laterality Date  . Targis  01/13/2001    Social History  Substance Use Topics  . Smoking status: Never Smoker   . Smokeless tobacco: None  . Alcohol  Use: No    Family History  Problem Relation Age of Onset  . Prostate cancer Neg Hx   . Bladder Cancer Neg Hx   . Kidney cancer Neg Hx     Medication list has been reviewed and updated.  Physical Examination: BP 128/60 mmHg  Pulse 79  Temp(Src) 98.2 F (36.8 C)  Resp 18  Ht _0  (1.702 m)  Wt 166 lb 1 oz (75.325 kg)  BMI 26.00 kg/m2  SpO2 96%  Physical Exam  Constitutional: He appears well-developed and well-nourished.  Cardiovascular: Normal rate, regular rhythm and normal heart sounds.   Pulmonary/Chest: Effort normal and breath sounds normal.  Musculoskeletal: He exhibits no edema.  Neurological: He is alert.  Skin: Skin is warm and dry.  Psychiatric: He has a normal mood and affect. His behavior is normal.  Nursing note and vitals reviewed.   Assessment and Plan:  1. Benign essential HTN Adequate control, change metoprolol tartrate 25 mg bid to metoprolol succinate 50 mg daily to simplify med regimen - Comprehensive Metabolic Panel (CMET) - CBC - Vitamin D (25 hydroxy) - B12  2. Supraventricular tachycardia ([place]) Well controlled on metoprolol, followed by cards, consider TSH next visit  3. CKD (chronic kidney disease) stage 3, GFR 30-59 ml/min D/c aspirin, monitor  4. BPH with obstruction/lower urinary tract symptoms Nocturia stable, followed by urology  5. Med review Consider d/c calcium supplement as no clear indication for use  Return in about 6 months (around 09/03/2016).  Satira Anis. Kamilah Correia, Jr.  MD [facility]  03/04/2016

## 2016-03-05 ENCOUNTER — Other Ambulatory Visit: Payer: Self-pay | Admitting: Family Medicine

## 2016-03-05 LAB — COMPREHENSIVE METABOLIC PANEL
ALK PHOS: 60 IU/L (ref 39–117)
ALT: 13 IU/L (ref 0–44)
AST: 23 IU/L (ref 0–40)
Albumin/Globulin Ratio: 1.7 (ref 1.2–2.2)
Albumin: 4.2 g/dL (ref 3.2–4.6)
BILIRUBIN TOTAL: 0.6 mg/dL (ref 0.0–1.2)
BUN/Creatinine Ratio: 20 (ref 10–24)
BUN: 33 mg/dL (ref 10–36)
CHLORIDE: 100 mmol/L (ref 96–106)
CO2: 25 mmol/L (ref 18–29)
Calcium: 9.1 mg/dL (ref 8.6–10.2)
Creatinine, Ser: 1.62 mg/dL — ABNORMAL HIGH (ref 0.76–1.27)
GFR calc Af Amer: 40 mL/min/{1.73_m2} — ABNORMAL LOW (ref >59)
GFR calc non Af Amer: 34 mL/min/{1.73_m2} — ABNORMAL LOW (ref >59)
GLUCOSE: 104 mg/dL — AB (ref 65–99)
Globulin, Total: 2.5 g/dL (ref 1.5–4.5)
POTASSIUM: 4.8 mmol/L (ref 3.5–5.2)
Sodium: 140 mmol/L (ref 134–144)
TOTAL PROTEIN: 6.7 g/dL (ref 6.0–8.5)

## 2016-03-05 LAB — CBC
HEMATOCRIT: 41.5 % (ref 37.5–51.0)
HEMOGLOBIN: 13.9 g/dL (ref 12.6–17.7)
MCH: 28.8 pg (ref 26.6–33.0)
MCHC: 33.5 g/dL (ref 31.5–35.7)
MCV: 86 fL (ref 79–97)
Platelets: 222 10*3/uL (ref 150–379)
RBC: 4.82 x10E6/uL (ref 4.14–5.80)
RDW: 13.6 % (ref 12.3–15.4)
WBC: 5.4 10*3/uL (ref 3.4–10.8)

## 2016-03-05 LAB — VITAMIN B12: VITAMIN B 12: 408 pg/mL (ref 211–946)

## 2016-03-05 LAB — VITAMIN D 25 HYDROXY (VIT D DEFICIENCY, FRACTURES): VIT D 25 HYDROXY: 33.6 ng/mL (ref 30.0–100.0)

## 2016-06-13 ENCOUNTER — Ambulatory Visit (INDEPENDENT_AMBULATORY_CARE_PROVIDER_SITE_OTHER): Payer: Medicare Other | Admitting: Urology

## 2016-06-13 ENCOUNTER — Encounter: Payer: Self-pay | Admitting: Urology

## 2016-06-13 VITALS — BP 189/69 | HR 66 | Ht 67.0 in | Wt 166.3 lb

## 2016-06-13 DIAGNOSIS — N401 Enlarged prostate with lower urinary tract symptoms: Secondary | ICD-10-CM | POA: Diagnosis not present

## 2016-06-13 DIAGNOSIS — N138 Other obstructive and reflux uropathy: Secondary | ICD-10-CM

## 2016-06-13 NOTE — Progress Notes (Signed)
11:19 AM   Mario Aguilar 14-May-1914 [id number]  Referring provider: Dennison Mascot, MD [address] [place], [place] [address]  Chief Complaint  Patient presents with  . Follow-up    6 month follow up gynecomastia / bph    HPI: Patient is a 82 year old Caucasian male with bilateral breast tenderness and  BPH and LUTS who presents today for 6 month follow-up.  BPH WITH LUTS His IPSS score today is 5 , which is mild lower urinary tract symptomatology. He is delighted with his quality life due to his urinary symptoms. His major complaint today having to take medication.   He denies any dysuria, hematuria or suprapubic pain.  He discontinued the Jalyn at his last visit with [place].   His has had TARGIS in 2002.  He also denies any recent fevers, chills, nausea or vomiting.  He does not have a family history of PCa.  He would like to discontinue his office visits with [place] due to his age.       IPSS    Row Name 06/13/16 1100         International Prostate Symptom Score   How often have you had the sensation of not emptying your bladder? Not at All     How often have you had to urinate less than every two hours? Not at All     How often have you found you stopped and started again several times when you urinated? Less than 1 in 5 times     How often have you found it difficult to postpone urination? Not at All     How often have you had a weak urinary stream? Less than half the time     How often have you had to strain to start urination? Not at All     How many times did you typically get up at night to urinate? 2 Times     Total IPSS Score 5       Quality of Life due to urinary symptoms   If you were to spend the rest of your life with your urinary condition just the way it is now how would you feel about that? Delighted        Score:  1-7 Mild 8-19 Moderate 20-35 Severe  Breast tenderness His breast tissue is less tender since he has discontinued his  dutasteride.    PMH: Past Medical History:  Diagnosis Date  . BPH (benign prostatic hypertrophy)   . Elevated PSA   . HTN (hypertension)   . Prostatitis   . Recurrent UTI     Surgical History: Past Surgical History:  Procedure Laterality Date  . Targis  01/13/2001    Home Medications:    Medication List       Accurate as of 06/13/16 11:19 AM. Always use your most recent med list.          aspirin EC 81 MG tablet Take by mouth.   CALCIUM 500/D 500-200 MG-UNIT tablet Generic drug:  calcium-vitamin D Take by mouth.   metoprolol succinate 50 MG 24 hr tablet Commonly known as:  TOPROL-XL Take 1 tablet (50 mg total) by mouth daily.       Allergies:  Allergies  Allergen Reactions  . Amoxicillin Itching  . Avodart [Dutasteride]     Caused gynecomastia  . Diphenhydramine   . Lisinopril Cough  . Amlodipine Swelling and Rash    Family History: Family History  Problem Relation Age  of Onset  . Prostate cancer Neg Hx   . Bladder Cancer Neg Hx   . Kidney cancer Neg Hx     Social History:  reports that he has never smoked. He has never used smokeless tobacco. He reports that he does not drink alcohol or use drugs.  ROS: UROLOGY Frequent Urination?: No Hard to postpone urination?: No Burning/pain with urination?: No Get up at night to urinate?: No Leakage of urine?: No Urine stream starts and stops?: No Trouble starting stream?: No Do you have to strain to urinate?: No Blood in urine?: No Urinary tract infection?: No Sexually transmitted disease?: No Injury to kidneys or bladder?: No Painful intercourse?: No Weak stream?: No Erection problems?: No Penile pain?: No  Gastrointestinal Nausea?: No Vomiting?: No Indigestion/heartburn?: No Diarrhea?: No Constipation?: No  Constitutional Fever: No Night sweats?: No Weight loss?: No Fatigue?: No  Skin Skin rash/lesions?: No Itching?: No  Eyes Blurred vision?: No Double vision?:  No  Ears/Nose/Throat Sore throat?: No Sinus problems?: No  Hematologic/Lymphatic Swollen glands?: No Easy bruising?: No  Cardiovascular Leg swelling?: No Chest pain?: No  Respiratory Cough?: No Shortness of breath?: No  Endocrine Excessive thirst?: No  Musculoskeletal Back pain?: No Joint pain?: No  Neurological Headaches?: No Dizziness?: No  Psychologic Depression?: No Anxiety?: No  Physical Exam: BP (!) 189/69   Pulse 66   Ht 5\' 7"  (1.702 m)   Wt 166 lb 4.8 oz (75.4 kg)   BMI 26.05 kg/m   GU: Patient with uncircumcised phallus. Foreskin easily retracted.  Urethral meatus is patent.  No penile discharge. No penile lesions or rashes. Scrotum without lesions, cysts, rashes and/or edema.  Testicles are located scrotally bilaterally. No masses are appreciated in the testicles. Left and right epididymis are normal. Rectal: Patient with  normal sphincter tone. Perineum without scarring or rashes. No rectal masses are appreciated. Prostate is approximately 35 grams, no nodules are appreciated. Seminal vesicles are normal.   Laboratory Data:      Most recent PSA was 2.7 ng/mL on 12/09/2014   Assessment & Plan:   1. Gynecomastia:   Patient does have breast tissue in each breast which has reduced since discontinuing the dutasteride.  The pain has greatly diminshed since he stopped the dutasteride.   He will f/u prn.    2. BPH with LUTS:   IPSS score is 5/0 without the Jalyn.   He will f/u prn.    Return if symptoms worsen or fail to improve.  Michiel CowboySHANNON Marik Sedore, PA-C  [facility] [address], [address] [place], [place] [address] [phone]

## 2016-08-27 ENCOUNTER — Ambulatory Visit (INDEPENDENT_AMBULATORY_CARE_PROVIDER_SITE_OTHER): Payer: Medicare Other | Admitting: Family Medicine

## 2016-08-27 ENCOUNTER — Encounter: Payer: Self-pay | Admitting: Family Medicine

## 2016-08-27 ENCOUNTER — Ambulatory Visit: Payer: Medicare Other | Admitting: Family Medicine

## 2016-08-27 VITALS — BP 134/64 | HR 67 | Temp 97.9°F | Resp 16 | Ht 67.0 in | Wt 165.1 lb

## 2016-08-27 DIAGNOSIS — I10 Essential (primary) hypertension: Secondary | ICD-10-CM

## 2016-08-27 DIAGNOSIS — H9190 Unspecified hearing loss, unspecified ear: Secondary | ICD-10-CM | POA: Insufficient documentation

## 2016-08-27 DIAGNOSIS — D692 Other nonthrombocytopenic purpura: Secondary | ICD-10-CM | POA: Diagnosis not present

## 2016-08-27 DIAGNOSIS — I38 Endocarditis, valve unspecified: Secondary | ICD-10-CM | POA: Diagnosis not present

## 2016-08-27 DIAGNOSIS — H9193 Unspecified hearing loss, bilateral: Secondary | ICD-10-CM | POA: Diagnosis not present

## 2016-08-27 DIAGNOSIS — N138 Other obstructive and reflux uropathy: Secondary | ICD-10-CM | POA: Diagnosis not present

## 2016-08-27 DIAGNOSIS — R0789 Other chest pain: Secondary | ICD-10-CM

## 2016-08-27 DIAGNOSIS — N401 Enlarged prostate with lower urinary tract symptoms: Secondary | ICD-10-CM | POA: Diagnosis not present

## 2016-08-27 DIAGNOSIS — R2681 Unsteadiness on feet: Secondary | ICD-10-CM | POA: Insufficient documentation

## 2016-08-27 DIAGNOSIS — M6530 Trigger finger, unspecified finger: Secondary | ICD-10-CM

## 2016-08-27 DIAGNOSIS — N183 Chronic kidney disease, stage 3 unspecified: Secondary | ICD-10-CM

## 2016-08-27 DIAGNOSIS — I471 Supraventricular tachycardia: Secondary | ICD-10-CM | POA: Diagnosis not present

## 2016-08-27 NOTE — Progress Notes (Signed)
Name: Mario MillardLynn P Aguilar   MRN: [id number]    DOB: 1914-08-13   Date:08/27/2016       Progress Note  Subjective  Chief Complaint  Chief Complaint  Patient presents with  . Medication Refill    6 month F/U  . Hypertension    Denies any symptoms, but had one episode of chest discomfort this morning    HPI  HTN: he is on lopressor twice daily, denies palpitation, he has mild substernal chest pain, described as sharp this am that lasted 30 minutes. No symptoms at this time, denies SOB. He denies orthopnea.  Hearing loss: he wears hearing aid.   Hyperglycemia: last hgbA1C slightly above goal, family history of DM, he denies polyphagia, polyuria or polydipsia. He does not want to be checked for DM  BPH: he was seen by Urologist, he still has nocturia twice per night. He uses a walker/ or walker  and discussed using bedside commode but he does not want to change it. He also does not like using a life line  SVT : he is doing well, denies palpitation, or fluttering on his chest   Gait instability: he uses a walker because of lack of balance, he refuses to have PT at this time  Patient Active Problem List   Diagnosis Date Noted  . 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 Benign essential HTN 01/04/2015  . Hyperglycemia 12/07/2013  . Supraventricular tachycardia (HCC) 12/07/2013  . Heart valve disease 12/07/2013    Past Surgical History:  Procedure Laterality Date  . Targis  01/13/2001    Family History  Problem Relation Age of Onset  . Diabetes Mother   . Heart disease Father   . Alcohol abuse Sister   . Prostate cancer Neg Hx   . Bladder Cancer Neg Hx   . Kidney cancer Neg Hx     Social History   Social History  . Marital status: Widowed    Spouse name: N/A  . Number of children: N/A  . Years of education: N/A   Occupational History  . Not on file.   Social History Main Topics  . Smoking status: Former Smoker    Years: 20.00    Types:  Cigarettes, Pipe    Start date: 08/27/1936    Quit date: 08/27/1956  . Smokeless tobacco: Never Used  . Alcohol use No  . Drug use: No  . Sexual activity: No   Other Topics Concern  . Not on file   Social History Narrative   Lives alone. Wife died in 2009. She has 4 daughters that rotate to take care of him during the day. Spends the nights by himself alone. He has good memory. He has someone that cleans his house once a week.      Current Outpatient Prescriptions:  .  metoprolol tartrate (LOPRESSOR) 25 MG tablet, Take 25 mg by mouth 2 (two) times daily., Disp: , Rfl:   Allergies  Allergen Reactions  . Amoxicillin Itching  . Avodart [Dutasteride]     Caused gynecomastia  . Diphenhydramine   . Lisinopril Cough  . Amlodipine Swelling and Rash     ROS  Constitutional: Negative for fever or weight change.  Respiratory: Negative for cough and shortness of breath.   Cardiovascular: Positive for chest pain ( this morning but resolved now ) but no palpitations.  Gastrointestinal: Negative for abdominal pain, no bowel changes.  Musculoskeletal: Positive for gait problem no  joint swelling.  Skin: Negative for  rash.  Neurological: Negative for dizziness or headache.  No other specific complaints in a complete review of systems (except as listed in HPI above).  Objective  Vitals:   08/27/16 1032  BP: 134/64  Pulse: 67  Resp: 16  Temp: 97.9 F (36.6 C)  TempSrc: Oral  SpO2: 96%  Weight: 165 lb 1.6 oz (74.9 kg)  Height: 5\' 7"  (1.702 m)    Body mass index is 25.86 kg/m.  Physical Exam  Constitutional: Patient appears well-developedNo distress.  HEENT: head atraumatic, normocephalic, pupils equal and reactive to light,neck supple, throat within normal limits Cardiovascular: Normal rate, regular rhythm and normal heart sounds.  No murmur heard. No BLE edema. Pulmonary/Chest: Effort normal and breath sounds normal. No respiratory distress. Abdominal: Soft.  There is no  tenderness. Psychiatric: Patient has a normal mood and affect. behavior is normal. Judgment and thought content normal. Skin: senile purpura bilateral arms Muscular Skeletal: walks slowly uses a walker  PHQ2/9: Depression screen [facility] 2/9 08/27/2016 08/22/2015 07/04/2015  Decreased Interest 0 0 0  Down, Depressed, Hopeless 0 0 0  PHQ - 2 Score 0 0 0     Fall Risk: Fall Risk  08/27/2016 08/22/2015 07/04/2015  Falls in the past year? No No No     Functional Status Survey: Is the patient deaf or have difficulty hearing?: Yes (Bilateral hearing aids) Does the patient have difficulty seeing, even when wearing glasses/contacts?: Yes (Wears glasses) Does the patient have difficulty concentrating, remembering, or making decisions?: No Does the patient have difficulty walking or climbing stairs?: Yes (Walks with a walker) Does the patient have difficulty dressing or bathing?: No Does the patient have difficulty doing errands alone such as visiting a doctor's office or shopping?: Yes (Does not drive)   Assessment & Plan  1. Benign essential HTN  - EKG 12-Lead  2. Heart valve disease  - EKG 12-Lead  3. Chest discomfort  - EKG 12-Lead - unremarkable, explained it could be cardiac, however he is on medical management only and off aspirin since June, explained he can resume that , explained need to contact Dr. [facility]owalski [facility] symptoms associated with diaphoresis, nausea, vomiting or not resolve  4. BPH with obstruction/lower urinary tract symptoms  stable  5. Senile purpura (HCC)  Doing well   6. CKD (chronic kidney disease) stage 3, GFR 30-59 ml/min  He does not want to be checked for it  7. Supraventricular tachycardia (HCC)  Doing well on lopressor and sees cardiologist twice a year - Ambulatory referral to Home Health  8. Bilateral hearing loss, unspecified hearing loss type  He has bilateral hearing aid, but still has difficulty hearing - Ambulatory referral  to Home Health  9. Gait instability  - Ambulatory referral to Home Health  10. Trigger finger of left hand, unspecified finger  Reassurance for now

## 2016-08-28 ENCOUNTER — Telehealth: Payer: Self-pay

## 2016-08-28 NOTE — Telephone Encounter (Signed)
[facility], brought a note by stating that this patient's daughter declined the referral b/c she did not think it would do him any good.   Dr. Carlynn PurlSowles was informed and the letter was scanned to this patient's chart.

## 2017-01-31 ENCOUNTER — Encounter: Payer: Self-pay | Admitting: Family Medicine

## 2017-01-31 ENCOUNTER — Ambulatory Visit (INDEPENDENT_AMBULATORY_CARE_PROVIDER_SITE_OTHER): Payer: Medicare Other | Admitting: Family Medicine

## 2017-01-31 VITALS — BP 122/64 | HR 64 | Temp 98.4°F | Resp 16 | Ht 67.0 in | Wt 161.8 lb

## 2017-01-31 DIAGNOSIS — H0289 Other specified disorders of eyelid: Secondary | ICD-10-CM | POA: Diagnosis not present

## 2017-01-31 DIAGNOSIS — N138 Other obstructive and reflux uropathy: Secondary | ICD-10-CM

## 2017-01-31 DIAGNOSIS — D692 Other nonthrombocytopenic purpura: Secondary | ICD-10-CM | POA: Diagnosis not present

## 2017-01-31 DIAGNOSIS — R2681 Unsteadiness on feet: Secondary | ICD-10-CM | POA: Diagnosis not present

## 2017-01-31 DIAGNOSIS — Z9181 History of falling: Secondary | ICD-10-CM | POA: Diagnosis not present

## 2017-01-31 DIAGNOSIS — N401 Enlarged prostate with lower urinary tract symptoms: Secondary | ICD-10-CM

## 2017-01-31 DIAGNOSIS — H9193 Unspecified hearing loss, bilateral: Secondary | ICD-10-CM

## 2017-01-31 DIAGNOSIS — I10 Essential (primary) hypertension: Secondary | ICD-10-CM

## 2017-01-31 MED ORDER — OLOPATADINE HCL 0.1 % OP SOLN: 1.0000 [drp] | Freq: Two times a day (BID) | OPHTHALMIC | 0 refills | Status: DC

## 2017-01-31 NOTE — Progress Notes (Signed)
Name: Mario Aguilar   MRN: [id number]    DOB: [birth year]   Date:01/31/2017       Progress Note  Subjective  Chief Complaint  Chief Complaint  Patient presents with  . Medication Refill    6 month F/U  . Hypertension    Seen Dr. Gwen Pounds and everything looked good kept him on the same medication  . Eye Problem    Right Lower Eye lid has been bothering him, red   . Hair/Scalp Problem    Has been itchy     HPI  He does not want to have labs done.  HTN: he is on lopressor twice daily, denies palpitation, chest pain has resolved. He  denies SOB. He denies orthopnea.  Hearing loss: he wears hearing aid. He had two daughter's with him today Jinger Neighbors, Cherylann Banas )  Hyperglycemia: last hgbA1C slightly above goal, family history of DM, he denies polyphagia, polyuria or polydipsia. He does not want to be checked for DM  BPH: he was seen by Urologist, he still has nocturia twice per night. He uses a walker/ or walker  and discussed using bedside commode but he does not want to change it. He also does not like using a life line  SVT : he is doing well, denies palpitation, or fluttering on his chest   Gait instability: he uses a walker because of lack of balance, he fell in April while taking his PJ shirt off one morning, fell forward and had a laceration on left arm, he went to Urgent Care and is doing well now, discussed PT and he is willing to go now   Patient Active Problem List   Diagnosis Date Noted  . Hearing loss 08/27/2016  . Trigger finger of left hand 08/27/2016  . Senile purpura (HCC) 08/27/2016  . Gait instability 08/27/2016  . 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 Benign essential HTN 01/04/2015  . Hyperglycemia 12/07/2013  . Supraventricular tachycardia (HCC) 12/07/2013  . Heart valve disease 12/07/2013    Past Surgical History:  Procedure Laterality Date  . Targis  01/13/2001    Family History  Problem Relation Age of Onset   . Diabetes Mother   . Heart disease Father   . Alcohol abuse Sister   . Prostate cancer Neg Hx   . Bladder Cancer Neg Hx   . Kidney cancer Neg Hx     Social History   Social History  . Marital status: Widowed    Spouse name: N/A  . Number of children: N/A  . Years of education: N/A   Occupational History  . Not on file.   Social History Main Topics  . Smoking status: Former Smoker    Years: 20.00    Types: Cigarettes, Pipe    Start date: 08/27/1936    Quit date: 08/27/1956  . Smokeless tobacco: Never Used  . Alcohol use No  . Drug use: No  . Sexual activity: No   Other Topics Concern  . Not on file   Social History Narrative   Lives alone. Wife died in 2008/02/20. She has 4 daughters that rotate to take care of him during the day. Spends the nights by himself alone. He has good memory. He has someone that cleans his house once a week.    He has 4 daughters     Current Outpatient Prescriptions:  .  metoprolol tartrate (LOPRESSOR) 25 MG tablet, Take 25 mg by mouth 2 (two) times daily.,  Disp: , Rfl:   Allergies  Allergen Reactions  . Amoxicillin Itching  . Avodart [Dutasteride]     Caused gynecomastia  . Diphenhydramine   . Lisinopril Cough  . Amlodipine Swelling and Rash     ROS  Constitutional: Negative for fever or significant  weight change.  Respiratory: Negative for cough and shortness of breath.   Cardiovascular: Negative for chest pain or palpitations.  Gastrointestinal: Negative for abdominal pain, no bowel changes.  Musculoskeletal: Positive  for gait problem but no joint swelling.  Skin: Negative for rash.  Neurological: Negative for dizziness or headache.  No other specific complaints in a complete review of systems (except as listed in HPI above).  Objective  Vitals:   01/31/17 1121  BP: 122/64  Pulse: 64  Resp: 16  Temp: 98.4 F (36.9 C)  TempSrc: Oral  SpO2: 98%  Weight: 161 lb 12.8 oz (73.4 kg)  Height: 5\' 7"  (1.702 m)    Body mass  index is 25.34 kg/m.  Physical Exam  Constitutional: Patient appears well-developed and well-nourished. No distress.  HEENT: head atraumatic, normocephalic, pupils equal and reactive to light, lower right lid is inflamed and red neck supple, throat within normal limits Cardiovascular: Normal rate, regular rhythm and normal heart sounds.  No murmur heard. No BLE edema. Pulmonary/Chest: Effort normal and breath sounds normal. No respiratory distress. Abdominal: Soft.  There is no tenderness. Skin: healing laceration on left arm, scalp is clear Psychiatric: Patient has a normal mood and affect. behavior is normal. Judgment and thought content normal.  PHQ2/9: Depression screen [facility] 2/9 01/31/2017 08/27/2016 08/22/2015 07/04/2015  Decreased Interest 0 0 0 0  Down, Depressed, Hopeless 0 0 0 0  PHQ - 2 Score 0 0 0 0     Fall Risk: Fall Risk  01/31/2017 08/27/2016 08/22/2015 07/04/2015  Falls in the past year? Yes No No No  Number falls in past yr: 1 - - -  Injury with Fall? Yes - - -     Functional Status Survey: Is the patient deaf or have difficulty hearing?: Yes (Has hearing aids) Does the patient have difficulty seeing, even when wearing glasses/contacts?: No Does the patient have difficulty concentrating, remembering, or making decisions?: No Does the patient have difficulty walking or climbing stairs?: Yes (Uses walker) Does the patient have difficulty dressing or bathing?: No Does the patient have difficulty doing errands alone such as visiting a doctor's office or shopping?: Yes    Assessment & Plan  1. Benign essential HTN  At goal   2. Senile purpura (HCC)  reassurance  3. BPH with obstruction/lower urinary tract symptoms  stable  4. Bilateral hearing loss, unspecified hearing loss type  stable  5. Gait instability  Uses a walker  6. History of recent fall  - Ambulatory referral to Home Health  7. Irritation of eyelid  - olopatadine (PATANOL) 0.1 %  ophthalmic solution; Place 1 drop into both eyes 2 (two) times daily.  Dispense: 5 mL; Refill: 0

## 2017-02-06 ENCOUNTER — Telehealth: Payer: Self-pay | Admitting: Family Medicine

## 2017-02-06 NOTE — Telephone Encounter (Signed)
Mario LefortLemuel from [facility] called wanting a call back. [phone]

## 2017-02-07 NOTE — Telephone Encounter (Signed)
Melinda CrutchLemuel Panlilio, Physical Therapist from [facility], called stating that this patient will benefit from physical therapy at home.   As follows:  1 time a week for 1 week Twice a week for 2 weeks 1 time per week for 2 weeks   Verbal was given, but a written order will be faxed to our office.

## 2017-02-10 ENCOUNTER — Telehealth: Payer: Self-pay | Admitting: Family Medicine

## 2017-02-10 NOTE — Telephone Encounter (Signed)
Please make sure checking bp while sitting and resting for 5 minutes. His bp in our office has bee normal

## 2017-02-10 NOTE — Telephone Encounter (Signed)
Mario Aguilar physical therapist [facility] is seeing the patient and his blood pressure at rest is 170/70 made him do light exercises with breaks and standing 180/80. Pt deny any symptoms. Just reporting high bloodpressure with no symptoms. [phone]

## 2017-02-11 NOTE — Telephone Encounter (Signed)
Mario Aguilar was notified and states he will see him again Thursday and recheck BP while resting for at least 5 minutes.

## 2017-04-23 ENCOUNTER — Ambulatory Visit (INDEPENDENT_AMBULATORY_CARE_PROVIDER_SITE_OTHER): Payer: Medicare Other | Admitting: Family Medicine

## 2017-04-23 ENCOUNTER — Encounter: Payer: Self-pay | Admitting: Family Medicine

## 2017-04-23 VITALS — BP 132/70 | HR 74 | Temp 97.6°F | Resp 16 | Ht 67.0 in | Wt 163.6 lb

## 2017-04-23 DIAGNOSIS — L03011 Cellulitis of right finger: Secondary | ICD-10-CM | POA: Diagnosis not present

## 2017-04-23 MED ORDER — DOXYCYCLINE HYCLATE 100 MG PO TABS: 100.0000 mg | ORAL_TABLET | Freq: Two times a day (BID) | ORAL | 0 refills | Status: AC

## 2017-04-23 MED ORDER — DOXYCYCLINE HYCLATE 100 MG PO TBEC: 100.0000 mg | DELAYED_RELEASE_TABLET | Freq: Two times a day (BID) | ORAL | 0 refills | Status: DC

## 2017-04-23 NOTE — Addendum Note (Signed)
Addended by: Doren CustardBOYCE, Eastyn Dattilo E on: 04/23/2017 02:41 PM   Modules accepted: Orders

## 2017-04-23 NOTE — Patient Instructions (Addendum)
Soak finger in Epsom salt and warm water. Take Probiotic over the counter daily. If redness or swelling worsen past the areas marked or if redness is not better once antibiotic is completed, please call to schedule a follow up visit.   Cellulitis, Adult Cellulitis is a skin infection. The infected area is usually red and sore. This condition occurs most often in the arms and lower legs. It is very important to get treated for this condition. Follow these instructions at home:  Take over-the-counter and prescription medicines only as told by your doctor.  If you were prescribed an antibiotic medicine, take it as told by your doctor. Do not stop taking the antibiotic even if you start to feel better.  Drink enough fluid to keep your pee (urine) clear or pale yellow.  Do not touch or rub the infected area.  Raise (elevate) the infected area above the level of your heart while you are sitting or lying down.  Place warm or cold wet cloths (warm or cold compresses) on the infected area. Do this as told by your doctor.  Keep all follow-up visits as told by your doctor. This is important. These visits let your doctor make sure your infection is not getting worse. Contact a doctor if:  You have a fever.  Your symptoms do not get better after 1-2 days of treatment.  Your bone or joint under the infected area starts to hurt after the skin has healed.  Your infection comes back. This can happen in the same area or another area.  You have a swollen bump in the infected area.  You have new symptoms.  You feel ill and also have muscle aches and pains. Get help right away if:  Your symptoms get worse.  You feel very sleepy.  You throw up (vomit) or have watery poop (diarrhea) for a long time.  There are red streaks coming from the infected area.  Your red area gets larger.  Your red area turns darker. This information is not intended to replace advice given to you by your health care  provider. Make sure you discuss any questions you have with your health care provider. Document Released: 02/26/2008 Document Revised: 02/15/2016 Document Reviewed: 07/19/2015 Elsevier Interactive Patient Education  2018 [employer]

## 2017-04-23 NOTE — Progress Notes (Addendum)
Name: Mario Aguilar   MRN: [id number]    DOB: Nov 28, 1913   Date:04/23/2017       Progress Note  Subjective  Chief Complaint  Chief Complaint  Patient presents with  . Hand Injury    trigger finger right hand, red, painful, stiff for 6 days    HPI Pt is accompanied by two of his daughters who also contribute to the history. Pt presents with 5 day history of right ring finger redness and swelling with pain on movement, also notes some right middle finger pain with movement, but no redness. He does note two small breaks in his skin on the RUE - one on the posterior hand and one on the posterior proximal forearm. No fevers/chills, fatigue, behavior changes, confusion, or other complaints today. Patient is in good spirits and answers questions appropriately.  Patient Active Problem List   Diagnosis Date Noted  . Hearing loss 08/27/2016  . Trigger finger of left hand 08/27/2016  . Senile purpura (HCC) 08/27/2016  . Gait instability 08/27/2016  . 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 Benign essential HTN 01/04/2015  . Hyperglycemia 12/07/2013  . Supraventricular tachycardia (HCC) 12/07/2013  . Heart valve disease 12/07/2013    Social History  Substance Use Topics  . Smoking status: Former Smoker    Years: 20.00    Types: Cigarettes, Pipe    Start date: 08/27/1936    Quit date: 08/27/1956  . Smokeless tobacco: Never Used  . Alcohol use No     Current Outpatient Prescriptions:  .  metoprolol tartrate (LOPRESSOR) 25 MG tablet, Take 25 mg by mouth 2 (two) times daily., Disp: , Rfl:  .  olopatadine (PATANOL) 0.1 % ophthalmic solution, Place 1 drop into both eyes 2 (two) times daily., Disp: 5 mL, Rfl: 0 .  doxycycline (DORYX) 100 MG EC tablet, Take 1 tablet (100 mg total) by mouth 2 (two) times daily., Disp: 14 tablet, Rfl: 0  Allergies  Allergen Reactions  . Amoxicillin Itching  . Avodart [Dutasteride]     Caused gynecomastia  . Diphenhydramine   .  Lisinopril Cough  . Amlodipine Swelling and Rash    ROS Constitutional: Negative for fever or weight change.  Respiratory: Negative for cough and shortness of breath.   Cardiovascular: Negative for chest pain or palpitations.  Gastrointestinal: Negative for abdominal pain, no bowel changes.  Musculoskeletal: Negative for gait problem; positive for joint swelling.  Skin: Negative for rash. Positive for erythema. Neurological: Negative for dizziness or headache.  No other specific complaints in a complete review of systems (except as listed in HPI above).  Objective  Vitals:   04/23/17 1327  BP: 132/70  Pulse: 74  Resp: 16  Temp: 97.6 F (36.4 C)  TempSrc: Oral  SpO2: 98%  Weight: 163 lb 9.6 oz (74.2 kg)  Height: 5\' 7"  (1.702 m)   Body mass index is 25.62 kg/m.  Nursing Note and Vital Signs reviewed.  Physical Exam Constitutional: Patient appears well-developed and well-nourished. No distress.  HEENT: head atraumatic, normocephalic Cardiovascular: Normal rate, regular rhythm, S1/S2 present.  No murmur or rub heard. No BLE edema. Pulmonary/Chest: Effort normal and breath sounds clear. No respiratory distress or retractions. Psychiatric: Patient has a normal mood and affect. behavior is normal. Judgment and thought content normal. MSK: FULL AROM of BUE. Mild swelling to right ring finger with tenderness on the right MIP and PIP joints.  Right middle finger is minimally tender on the PIP joint, no swelling or  erythema; palmar surface has fibrotic nodule present that appears to track with the right middle finger flexor tendon - non-tender, non-erythematous. Skin: Erythema with minimal heat over the right ring finger from PIP to MIP joint - this area is outlined with a skin marker for monitoring purposes.  1 small skin tear to the right hand and 1 small skin tear to the right proximal forearm - neither are erythematous, swelling or exhibit red streaking, neither are tender.  No  results found for this or any previous visit (from the past 2160 hour(s)).   Assessment & Plan  1. Cellulitis of finger of right hand - doxycycline (VIBRA-TABS) 100 MG tablet; Take 1 tablet (100 mg total) by mouth 2 (two) times daily.  Dispense: 14 tablet; Refill: 0 - Due to patient's advanced age, CKD history, and allergy to PCN's, we will treat with Doxycyline. - Patient's daughters will monitor redness, and will call if it comes out the border that was traced during our visit.  - Advised to soak area in epsom salt PRN  -Red flags and when to present for emergency care or RTC including fever >101.62F, fever, behavior changes, malaise/fatigue, increased pain/swelling, new/worsening/un-resolving symptoms, reviewed with patient at time of visit. Follow up and care instructions discussed and provided in AVS.  I have reviewed this encounter including the documentation in this note and/or discussed this patient with the Deboraha Sprangprovider,Gemini Bunte, FNP, NP-C. I am certifying that I agree with the content of this note as supervising physician.  Alba CoryKrichna Sowles, MD [facility] 04/23/2017, 9:41 PM

## 2017-05-31 ENCOUNTER — Emergency Department
Admission: EM | Admit: 2017-05-31 | Discharge: 2017-06-01 | Disposition: A | Discharge status: Home / Self Care | Payer: Medicare Other | Attending: Emergency Medicine | Admitting: Emergency Medicine

## 2017-05-31 ENCOUNTER — Emergency Department: Payer: Medicare Other

## 2017-05-31 ENCOUNTER — Encounter: Payer: Self-pay | Admitting: Emergency Medicine

## 2017-05-31 DIAGNOSIS — R509 Fever, unspecified: Secondary | ICD-10-CM | POA: Diagnosis not present

## 2017-05-31 DIAGNOSIS — Z79899 Other long term (current) drug therapy: Secondary | ICD-10-CM | POA: Insufficient documentation

## 2017-05-31 DIAGNOSIS — Z87891 Personal history of nicotine dependence: Secondary | ICD-10-CM | POA: Diagnosis not present

## 2017-05-31 DIAGNOSIS — I10 Essential (primary) hypertension: Secondary | ICD-10-CM | POA: Diagnosis not present

## 2017-05-31 DIAGNOSIS — R531 Weakness: Secondary | ICD-10-CM | POA: Insufficient documentation

## 2017-05-31 LAB — COMPREHENSIVE METABOLIC PANEL
ALBUMIN: 3.9 g/dL (ref 3.5–5.0)
ALK PHOS: 58 U/L (ref 38–126)
ALT: 13 U/L — ABNORMAL LOW (ref 17–63)
ANION GAP: 9 (ref 5–15)
AST: 22 U/L (ref 15–41)
BUN: 34 mg/dL — ABNORMAL HIGH (ref 6–20)
CO2: 21 mmol/L — AB (ref 22–32)
Calcium: 8.3 mg/dL — ABNORMAL LOW (ref 8.9–10.3)
Chloride: 105 mmol/L (ref 101–111)
Creatinine, Ser: 1.5 mg/dL — ABNORMAL HIGH (ref 0.61–1.24)
GFR calc Af Amer: 42 mL/min — ABNORMAL LOW (ref >60)
GFR calc non Af Amer: 36 mL/min — ABNORMAL LOW (ref >60)
GLUCOSE: 118 mg/dL — AB (ref 65–99)
POTASSIUM: 4 mmol/L (ref 3.5–5.1)
SODIUM: 135 mmol/L (ref 135–145)
Total Bilirubin: 1.5 mg/dL — ABNORMAL HIGH (ref 0.3–1.2)
Total Protein: 6.6 g/dL (ref 6.5–8.1)

## 2017-05-31 LAB — CBC WITH DIFFERENTIAL/PLATELET
Basophils Absolute: 0 10*3/uL (ref 0–0.1)
Basophils Relative: 0 %
Eosinophils Absolute: 0 10*3/uL (ref 0–0.7)
Eosinophils Relative: 0 %
HEMATOCRIT: 38.4 % — AB (ref 40.0–52.0)
Hemoglobin: 13 g/dL (ref 13.0–18.0)
LYMPHS PCT: 3 %
Lymphs Abs: 0.3 10*3/uL — ABNORMAL LOW (ref 1.0–3.6)
MCH: 29.2 pg (ref 26.0–34.0)
MCHC: 33.8 g/dL (ref 32.0–36.0)
MCV: 86.5 fL (ref 80.0–100.0)
MONO ABS: 0.5 10*3/uL (ref 0.2–1.0)
MONOS PCT: 5 %
NEUTROS ABS: 9 10*3/uL — AB (ref 1.4–6.5)
Neutrophils Relative %: 92 %
Platelets: 182 10*3/uL (ref 150–440)
RBC: 4.44 MIL/uL (ref 4.40–5.90)
RDW: 13.3 % (ref 11.5–14.5)
WBC: 9.8 10*3/uL (ref 3.8–10.6)

## 2017-05-31 LAB — LACTIC ACID, PLASMA: LACTIC ACID, VENOUS: 1.2 mmol/L (ref 0.5–1.9)

## 2017-05-31 LAB — URINALYSIS, ROUTINE W REFLEX MICROSCOPIC
BACTERIA UA: NONE SEEN
Bilirubin Urine: NEGATIVE
Glucose, UA: NEGATIVE mg/dL
Ketones, ur: NEGATIVE mg/dL
Leukocytes, UA: NEGATIVE
Nitrite: NEGATIVE
Protein, ur: NEGATIVE mg/dL
SPECIFIC GRAVITY, URINE: 1.021 (ref 1.005–1.030)
pH: 5 (ref 5.0–8.0)

## 2017-05-31 LAB — TROPONIN I: Troponin I: <0.03 ng/mL (ref <0.03)

## 2017-05-31 NOTE — ED Triage Notes (Signed)
EMS report family stated weakness and chest pain after lunch today, relieved by heating pad.  Pt. Family state diarrhea yesterday but resolved this morning.

## 2017-05-31 NOTE — ED Notes (Signed)
Family and pt. State diarrhea yesterday that resolved this a.m.  Pt. States weakness and chest pain this afternoon after lunch, resolved after using heating pad.  EMS report fever upon arrival, tylenol given and 81 ASA.

## 2017-05-31 NOTE — Discharge Instructions (Signed)
Please seek medical attention for any high fevers, chest pain, shortness of breath, change in behavior, persistent vomiting, bloody stool or any other new or concerning symptoms.  

## 2017-05-31 NOTE — ED Provider Notes (Signed)
[facility] Emergency Department Provider Note   ____________________________________________   I have reviewed the triage vital signs and the nursing notes.   HISTORY  Chief Complaint Weakness   History limited by: Not Limited   HPI Mario Aguilar is a [age] y.o. male who presents to the emergency department today because of concerns for weakness and chest pain. Patient has felt warm today. Had an episode of chest pain earlier today around lunchtime. He since then has now resolved. He denies any current chest pain, cough. Denies any recent vomiting. Stated he did have some diarrhea couple of days ago. He denies any abdominal pain.   Past Medical History:  Diagnosis Date  . BPH (benign prostatic hypertrophy)   . Elevated PSA   . HTN (hypertension)   . Prostatitis   . Recurrent UTI     Patient Active Problem List   Diagnosis Date Noted  . Hearing loss 08/27/2016  . Trigger finger of left hand 08/27/2016  . Senile purpura (HCC) 08/27/2016  . Gait instability 08/27/2016  . 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 Benign essential HTN 01/04/2015  . Hyperglycemia 12/07/2013  . Supraventricular tachycardia (HCC) 12/07/2013  . Heart valve disease 12/07/2013    Past Surgical History:  Procedure Laterality Date  . Targis  01/13/2001    Prior to Admission medications   Medication Sig Start Date End Date Taking? Authorizing Provider  metoprolol tartrate (LOPRESSOR) 25 MG tablet Take 25 mg by mouth 2 (two) times daily.    [provider]  olopatadine (PATANOL) 0.1 % ophthalmic solution Place 1 drop into both eyes 2 (two) times daily. 01/31/17   Alba CorySowles, Krichna, MD    Allergies Amoxicillin; Avodart [dutasteride]; Diphenhydramine; Lisinopril; and Amlodipine  Family History  Problem Relation Age of Onset  . Diabetes Mother   . Heart disease Father   . Alcohol abuse Sister   . Prostate cancer Neg Hx   . Bladder Cancer  Neg Hx   . Kidney cancer Neg Hx     Social History Social History  Substance Use Topics  . Smoking status: Former Smoker    Years: 20.00    Types: Cigarettes, Pipe    Start date: 08/27/1936    Quit date: 08/27/1956  . Smokeless tobacco: Never Used  . Alcohol use No    Review of Systems Constitutional: No fever/chills Eyes: No visual changes. ENT: No sore throat. Cardiovascular: positive for chest pain Respiratory: Denies shortness of breath. Gastrointestinal: No abdominal pain.  Positive or diarrhea couple of days ago which is now resolved Genitourinary: Negative for dysuria. Musculoskeletal: Negative for back pain. Skin: Negative for rash. Neurological: Negative for headaches, focal weakness or numbness.  ____________________________________________   PHYSICAL EXAM:  VITAL SIGNS: ED Triage Vitals  Enc Vitals Group     BP 05/31/17 1913 (!) 162/62     Pulse Rate 05/31/17 1913 95     Resp 05/31/17 1913 18     Temp 05/31/17 1913 (!) 100.5 F (38.1 C)     Temp Source 05/31/17 1913 Oral     SpO2 05/31/17 1910 96 %     Weight 05/31/17 1914 165 lb (74.8 kg)     Height 05/31/17 1914 5\' 7"  (1.702 m)   Constitutional: Alert and oriented. Well appearing and in no distress. Eyes: Conjunctivae are normal.  ENT   Head: Normocephalic and atraumatic.   Nose: No congestion/rhinnorhea.   Mouth/Throat: Mucous membranes are moist.   Neck: No stridor. Hematological/Lymphatic/Immunilogical:  No cervical lymphadenopathy. Cardiovascular: Normal rate, regular rhythm.  No murmurs, rubs, or gallops.  Respiratory: Normal respiratory effort without tachypnea nor retractions. Breath sounds are clear and equal bilaterally. No wheezes/rales/rhonchi. Gastrointestinal: Soft and non tender. No rebound. No guarding.  Genitourinary: Deferred Musculoskeletal: Normal range of motion in all extremities. No lower extremity edema. Neurologic:  Normal speech and language. No gross focal  neurologic deficits are appreciated.  Skin:  Skin is warm, dry and intact. No rash noted. Psychiatric: Mood and affect are normal. Speech and behavior are normal. Patient exhibits appropriate insight and judgment.  ____________________________________________    LABS (pertinent positives/negatives)  Labs Reviewed  COMPREHENSIVE METABOLIC PANEL - Abnormal; Notable for the following:       Result Value   CO2 21 (*)    Glucose, Bld 118 (*)    BUN 34 (*)    Creatinine, Ser 1.50 (*)    Calcium 8.3 (*)    ALT 13 (*)    Total Bilirubin 1.5 (*)    GFR calc non Af Amer 36 (*)    GFR calc Af Amer 42 (*)    All other components within normal limits  CBC WITH DIFFERENTIAL/PLATELET - Abnormal; Notable for the following:    HCT 38.4 (*)    Neutro Abs 9.0 (*)    Lymphs Abs 0.3 (*)    All other components within normal limits  URINALYSIS, ROUTINE W REFLEX MICROSCOPIC - Abnormal; Notable for the following:    Color, Urine YELLOW (*)    APPearance CLEAR (*)    Hgb urine dipstick MODERATE (*)    Squamous Epithelial / LPF 0-5 (*)    All other components within normal limits  CULTURE, BLOOD (ROUTINE X 2)  CULTURE, BLOOD (ROUTINE X 2)  LACTIC ACID, PLASMA  TROPONIN I  TROPONIN I  LACTIC ACID, PLASMA     ____________________________________________   EKG  I, Phineas Semen, attending physician, personally viewed and interpreted this EKG  EKG Time: 1931 Rate: 93 Rhythm: normal sinus rhythm Axis: right axis deviation Intervals: qtc 461 QRS: narrow ST changes: no st elevation Impression: abnormal ekg   ____________________________________________    RADIOLOGY  CXR   IMPRESSION:  Mild hyperinflation. Slight fibrosis or linear atelectasis in the  lung bases. No evidence of active pulmonary disease.    ____________________________________________   PROCEDURES  Procedures  ____________________________________________   INITIAL IMPRESSION / ASSESSMENT AND PLAN / ED  COURSE  Pertinent labs & imaging results that were available during my care of the patient were reviewed by me and considered in my medical decision making (see chart for details).  Patient presented to the emergency department today because of some concerns for weakness and an episode of chest pain earlier today. Patient initially did have a fever here. However no leukocytosis or lactic acidosis. The fever did resolve on its own. Workup without any obvious source of infection. Patient did feel improved here in the emergency department I did discussion with patient and family about possible infection. At this point given lack of source or other concerning findings will defer antibiotics. Patient and family were comfortable with the plan. Patient was able to get up and ambulate with no weakness here in the emergency department. We did discuss return precautions. Did encourage primary care follow-up.  ____________________________________________   FINAL CLINICAL IMPRESSION(S) / ED DIAGNOSES  Final diagnoses:  Weakness  Fever, unspecified fever cause     Note: This dictation was prepared with Dragon dictation. Any transcriptional errors that result  from this process are unintentional     Phineas Semen, MD 05/31/17 2351

## 2017-06-01 NOTE — ED Notes (Signed)
Pt. Going home with son. 

## 2017-06-01 NOTE — ED Notes (Signed)
Pt. Going home with family. 

## 2017-06-03 ENCOUNTER — Ambulatory Visit (INDEPENDENT_AMBULATORY_CARE_PROVIDER_SITE_OTHER): Payer: Medicare Other | Admitting: Family Medicine

## 2017-06-03 ENCOUNTER — Encounter: Payer: Self-pay | Admitting: Family Medicine

## 2017-06-03 VITALS — BP 124/64 | HR 76 | Temp 98.4°F | Resp 16 | Ht 67.0 in | Wt 164.2 lb

## 2017-06-03 DIAGNOSIS — I471 Supraventricular tachycardia: Secondary | ICD-10-CM

## 2017-06-03 DIAGNOSIS — Z23 Encounter for immunization: Secondary | ICD-10-CM

## 2017-06-03 DIAGNOSIS — D72828 Other elevated white blood cell count: Secondary | ICD-10-CM | POA: Diagnosis not present

## 2017-06-03 DIAGNOSIS — R531 Weakness: Secondary | ICD-10-CM | POA: Diagnosis not present

## 2017-06-03 DIAGNOSIS — D692 Other nonthrombocytopenic purpura: Secondary | ICD-10-CM

## 2017-06-03 LAB — CBC WITH DIFFERENTIAL/PLATELET
BASOS ABS: 18 {cells}/uL (ref 0–200)
Basophils Relative: 0.3 %
EOS ABS: 153 {cells}/uL (ref 15–500)
Eosinophils Relative: 2.6 %
HEMATOCRIT: 39.9 % (ref 38.5–50.0)
Hemoglobin: 13.1 g/dL — ABNORMAL LOW (ref 13.2–17.1)
LYMPHS ABS: 1422 {cells}/uL (ref 850–3900)
MCH: 28.1 pg (ref 27.0–33.0)
MCHC: 32.8 g/dL (ref 32.0–36.0)
MCV: 85.4 fL (ref 80.0–100.0)
MPV: 10.2 fL (ref 7.5–12.5)
Monocytes Relative: 10.6 %
NEUTROS PCT: 62.4 %
Neutro Abs: 3682 cells/uL (ref 1500–7800)
PLATELETS: 234 10*3/uL (ref 140–400)
RBC: 4.67 10*6/uL (ref 4.20–5.80)
RDW: 12.4 % (ref 11.0–15.0)
Total Lymphocyte: 24.1 %
WBC: 5.9 10*3/uL (ref 3.8–10.8)
WBCMIX: 625 {cells}/uL (ref 200–950)

## 2017-06-03 NOTE — Addendum Note (Signed)
Addended by: Alba CorySOWLES, Bradi [place] F on: 06/03/2017 11:33 AM   Modules accepted: Orders

## 2017-06-03 NOTE — Progress Notes (Addendum)
Name: Mario Aguilar   MRN: [id number]    DOB: 18-Jul-1914   Date:06/03/2017       Progress Note  Subjective  Chief Complaint  Chief Complaint  Patient presents with  . Hospitalization Follow-up    dehydration    HPI  EC follow up: he developed diarrhea on 05/31/2017 followed by severe weakness and left side chest pain and palpitation. EMS was called by his daughter Mario Aguilar) , he was found to have a fever and was transported to [facility]. He had a normal CXR, WBC was normal but elevated Neutrophils, urine culture not sent, but had two blood culture tubes that were negative. Cardiac enzymes negative and he was sent home with daughter. His daughter Mario Aguilar) and he is feeling better, no longer has chest pain or palpitation, ate a good dinner last night, diarrhea has resolved, no chills , nausea or vomiting. Based on [facility] labs he was not dehydrated, kidney function was stable upon discharge.    Patient Active Problem List   Diagnosis Date Noted  . Hearing loss 08/27/2016  . Trigger finger of left hand 08/27/2016  . Senile purpura ([place]) 08/27/2016  . Gait instability 08/27/2016  . 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 Benign essential HTN 01/04/2015  . Hyperglycemia 12/07/2013  . Supraventricular tachycardia ([place]) 12/07/2013  . Heart valve disease 12/07/2013    Past Surgical History:  Procedure Laterality Date  . Targis  01/13/2001    Family History  Problem Relation Age of Onset  . Diabetes Mother   . Heart disease Father   . Alcohol abuse Sister   . Prostate cancer Neg Hx   . Bladder Cancer Neg Hx   . Kidney cancer Neg Hx     Social History   Social History  . Marital status: Widowed    Spouse name: N/A  . Number of children: N/A  . Years of education: N/A   Occupational History  . Not on file.   Social History Main Topics  . Smoking status: Former Smoker    Years: 20.00    Types: Cigarettes, Pipe    Start date: 08/27/1936    Quit  date: 08/27/1956  . Smokeless tobacco: Never Used  . Alcohol use No  . Drug use: No  . Sexual activity: No   Other Topics Concern  . Not on file   Social History Narrative   Lives alone. Wife died in 12/03/07. She has 4 daughters that rotate to take care of him during the day. Spends the nights by himself alone. He has good memory. He has someone that cleans his house once a week.    He has 4 daughters     Current Outpatient Prescriptions:  .  metoprolol tartrate (LOPRESSOR) 25 MG tablet, Take 25 mg by mouth 2 (two) times daily., Disp: , Rfl:  .  Wheat Dextrin (BENEFIBER PO), Take by mouth every morning., Disp: , Rfl:  .  olopatadine (PATANOL) 0.1 % ophthalmic solution, Place 1 drop into both eyes 2 (two) times daily. (Patient not taking: Reported on 05/31/2017), Disp: 5 mL, Rfl: 0  Allergies  Allergen Reactions  . Amoxicillin Itching  . Avodart [Dutasteride]     Caused gynecomastia  . Diphenhydramine   . Lisinopril Cough  . Amlodipine Swelling and Rash     ROS  Ten systems reviewed and is negative except as mentioned in HPI   Objective  Vitals:   06/03/17 1047  BP: 124/64  Pulse:  76  Resp: 16  Temp: 98.4 F (36.9 C)  SpO2: 95%  Weight: 164 lb 4 oz (74.5 kg)  Height: _0  (1.702 m)    Body mass index is 25.73 kg/m.  Physical Exam  Constitutional: Patient appears well-developed and well-nourished. No distress.  HEENT: head atraumatic, normocephalic, pupils equal and reactive to light,  neck supple, throat within normal limits Cardiovascular: Normal rate, regular rhythm and normal heart sounds.  No murmur heard. No BLE edema. Pulmonary/Chest: Effort normal and breath sounds normal. No respiratory distress. Skin: senile purpura both arms Abdominal: Soft.  There is no tenderness. Psychiatric: Patient has a normal mood and affect. behavior is normal. Judgment and thought content normal.  Recent Results (from the past 2160 hour(s))  Comprehensive metabolic panel      Status: Abnormal   Collection Time: 05/31/17  7:20 PM  Result Value Ref Range   Sodium 135 135 - 145 mmol/L   Potassium 4.0 3.5 - 5.1 mmol/L   Chloride 105 101 - 111 mmol/L   CO2 21 (L) 22 - 32 mmol/L   Glucose, Bld 118 (H) 65 - 99 mg/dL   BUN 34 (H) 6 - 20 mg/dL   Creatinine, Ser 1.50 (H) 0.61 - 1.24 mg/dL   Calcium 8.3 (L) 8.9 - 10.3 mg/dL   Total Protein 6.6 6.5 - 8.1 g/dL   Albumin 3.9 3.5 - 5.0 g/dL   AST 22 15 - 41 U/L   ALT 13 (L) 17 - 63 U/L   Alkaline Phosphatase 58 38 - 126 U/L   Total Bilirubin 1.5 (H) 0.3 - 1.2 mg/dL   GFR calc non Af Amer 36 (L) >60 mL/min   GFR calc Af Amer 42 (L) >60 mL/min    Comment: (NOTE) The eGFR has been calculated using the CKD EPI equation. This calculation has not been validated in all clinical situations. eGFR's persistently <60 mL/min signify possible Chronic Kidney Disease.    Anion gap 9 5 - 15  CBC WITH DIFFERENTIAL     Status: Abnormal   Collection Time: 05/31/17  7:20 PM  Result Value Ref Range   WBC 9.8 3.8 - 10.6 K/uL   RBC 4.44 4.40 - 5.90 MIL/uL   Hemoglobin 13.0 13.0 - 18.0 g/dL   HCT 38.4 (L) 40.0 - 52.0 %   MCV 86.5 80.0 - 100.0 fL   MCH 29.2 26.0 - 34.0 pg   MCHC 33.8 32.0 - 36.0 g/dL   RDW 13.3 11.5 - 14.5 %   Platelets 182 150 - 440 K/uL   Neutrophils Relative % 92 %   Neutro Abs 9.0 (H) 1.4 - 6.5 K/uL   Lymphocytes Relative 3 %   Lymphs Abs 0.3 (L) 1.0 - 3.6 K/uL   Monocytes Relative 5 %   Monocytes Absolute 0.5 0.2 - 1.0 K/uL   Eosinophils Relative 0 %   Eosinophils Absolute 0.0 0 - 0.7 K/uL   Basophils Relative 0 %   Basophils Absolute 0.0 0 - 0.1 K/uL  Blood Culture (routine x 2)     Status: None (Preliminary result)   Collection Time: 05/31/17  7:20 PM  Result Value Ref Range   Specimen Description BLOOD BLOOD LEFT WRIST    Special Requests      BOTTLES DRAWN AEROBIC AND ANAEROBIC Blood Culture results may not be optimal due to an excessive volume of blood received in culture bottles   Culture NO  GROWTH 3 DAYS    Report Status PENDING   Blood Culture (  routine x 2)     Status: None (Preliminary result)   Collection Time: 05/31/17  7:20 PM  Result Value Ref Range   Specimen Description BLOOD RIGHT ANTECUBITAL    Special Requests      BOTTLES DRAWN AEROBIC AND ANAEROBIC Blood Culture adequate volume   Culture NO GROWTH 3 DAYS    Report Status PENDING   Urinalysis, Routine w reflex microscopic     Status: Abnormal   Collection Time: 05/31/17  7:20 PM  Result Value Ref Range   Color, Urine YELLOW (A) YELLOW   APPearance CLEAR (A) CLEAR   Specific Gravity, Urine 1.021 1.005 - 1.030   pH 5.0 5.0 - 8.0   Glucose, UA NEGATIVE NEGATIVE mg/dL   Hgb urine dipstick MODERATE (A) NEGATIVE   Bilirubin Urine NEGATIVE NEGATIVE   Ketones, ur NEGATIVE NEGATIVE mg/dL   Protein, ur NEGATIVE NEGATIVE mg/dL   Nitrite NEGATIVE NEGATIVE   Leukocytes, UA NEGATIVE NEGATIVE   RBC / HPF 0-5 0 - 5 RBC/hpf   WBC, UA 0-5 0 - 5 WBC/hpf   Bacteria, UA NONE SEEN NONE SEEN   Squamous Epithelial / LPF 0-5 (A) NONE SEEN   Mucus PRESENT   Lactic acid, plasma     Status: None   Collection Time: 05/31/17  7:20 PM  Result Value Ref Range   Lactic Acid, Venous 1.2 0.5 - 1.9 mmol/L  Troponin I     Status: None   Collection Time: 05/31/17  7:20 PM  Result Value Ref Range   Troponin I <0.03 <0.03 ng/mL  Troponin I     Status: None   Collection Time: 05/31/17 10:54 PM  Result Value Ref Range   Troponin I <0.03 <0.03 ng/mL     PHQ2/9: Depression screen [facility] 2/9 01/31/2017 08/27/2016 08/22/2015 07/04/2015  Decreased Interest 0 0 0 0  Down, Depressed, Hopeless 0 0 0 0  PHQ - 2 Score 0 0 0 0     Fall Risk: Fall Risk  01/31/2017 08/27/2016 08/22/2015 07/04/2015  Falls in the past year? Yes No No No  Number falls in past yr: 1 - - -  Injury with Fall? Yes - - -  Comment Right Elbow - - -     Assessment & Plan  1. Senile purpura (HCC)  Severe on both arms  2. Supraventricular tachycardia  (HCC)  Stable on beta-blocker  3. Acute neutrophilia  We will recheck levels, did not go home on antibiotics - CBC with Differential/Platelet - Urine Culture  4. Weakness  - Urine Culture  We will give rx for lift chair, it is taking him 30 minutes to get out of chair by himself.   5. Needs flu shot  - Flu vaccine HIGH DOSE PF

## 2017-06-04 LAB — URINE CULTURE
MICRO NUMBER:: 80999725
RESULT: NO GROWTH
SPECIMEN QUALITY:: ADEQUATE

## 2017-06-05 LAB — CULTURE, BLOOD (ROUTINE X 2)
CULTURE: NO GROWTH
Culture: NO GROWTH
Special Requests: ADEQUATE

## 2017-08-06 ENCOUNTER — Ambulatory Visit: Payer: Medicare Other | Admitting: Family Medicine

## 2017-08-06 ENCOUNTER — Encounter: Payer: Self-pay | Admitting: Family Medicine

## 2017-08-06 VITALS — BP 148/56 | HR 71 | Resp 14 | Ht 67.0 in | Wt 163.9 lb

## 2017-08-06 DIAGNOSIS — N183 Chronic kidney disease, stage 3 unspecified: Secondary | ICD-10-CM

## 2017-08-06 DIAGNOSIS — I471 Supraventricular tachycardia: Secondary | ICD-10-CM

## 2017-08-06 DIAGNOSIS — L299 Pruritus, unspecified: Secondary | ICD-10-CM

## 2017-08-06 DIAGNOSIS — H9193 Unspecified hearing loss, bilateral: Secondary | ICD-10-CM | POA: Diagnosis not present

## 2017-08-06 DIAGNOSIS — I10 Essential (primary) hypertension: Secondary | ICD-10-CM | POA: Diagnosis not present

## 2017-08-06 DIAGNOSIS — D692 Other nonthrombocytopenic purpura: Secondary | ICD-10-CM

## 2017-08-06 MED ORDER — LORATADINE 10 MG PO TABS: 10.0000 mg | ORAL_TABLET | Freq: Every day | ORAL | 0 refills | Status: DC

## 2017-08-06 NOTE — Progress Notes (Signed)
Name: Mario Aguilar   MRN: [id number]    DOB: [birth year]   Date:08/06/2017       Progress Note  Subjective  Chief Complaint  Chief Complaint  Patient presents with  . Hypertension  . Medication Refill  . Pruritis    especially at night    HPI  HTN: he is on lopressor twice daily, denies palpitation, chest pain has resolved. He  denies SOB. He denies orthopnea.  Pruritis: daughter's are here with him, he has a long history of pruritus. He states at times wakes him up at night. He states that has pruritis on scalp, legs and sometimes arms, anterior chest. He states seems to be getting worse.   Hearing loss: he wears hearing aid, but not working well . He had two daughter's with him today Mario Aguilar, Mario Aguilar )  Hyperglycemia: last hgbA1C slightly above goal, family history of DM, he denies polyphagia, polyuria or polydipsia. He does not want to be checked for DM  BPH: he was seen by Urologist, he still has nocturia twice per night. He uses a walker/ or walker and discussed using bedside commode but he does not want to change it. He also does not like using a life line  SVT : he is doing well, denies palpitation, or fluttering on his chest   Gait instability: he uses a walker because of lack of balance, he fell in April while taking his PJ shirt off one morning, fell forward and had a laceration on left arm, no falls since.   He lives alone, but four daughters rotate and someone visits him once a day.    Patient Active Problem List   Diagnosis Date Noted  . Hearing loss 08/27/2016  . Trigger finger of left hand 08/27/2016  . Senile purpura ([place]) 08/27/2016  . Gait instability 08/27/2016  . 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 Benign essential HTN 01/04/2015  . Hyperglycemia 12/07/2013  . Supraventricular tachycardia ([place]) 12/07/2013  . Heart valve disease 12/07/2013    Past Surgical History:  Procedure Laterality Date  . Targis   01/13/2001    Family History  Problem Relation Age of Onset  . Diabetes Mother   . Heart disease Father   . Alcohol abuse Sister   . Prostate cancer Neg Hx   . Bladder Cancer Neg Hx   . Kidney cancer Neg Hx     Social History   Socioeconomic History  . Marital status: Widowed    Spouse name: Not on file  . Number of children: Not on file  . Years of education: Not on file  . Highest education level: Not on file  Social Needs  . Financial resource strain: Not on file  . Food insecurity - worry: Not on file  . Food insecurity - inability: Not on file  . Transportation needs - medical: Not on file  . Transportation needs - non-medical: Not on file  Occupational History  . Not on file  Tobacco Use  . Smoking status: Former Smoker    Years: 20.00    Types: Cigarettes, Pipe    Start date: 08/27/1936    Last attempt to quit: 08/27/1956    Years since quitting: 60.9  . Smokeless tobacco: Never Used  Substance and Sexual Activity  . Alcohol use: No    Alcohol/week: 0.0 oz  . Drug use: No  . Sexual activity: No  Other Topics Concern  . Not on file  Social History Narrative  Lives alone. Wife died in 2009. She has 4 daughters that rotate to take care of him during the day. Spends the nights by himself alone. He has good memory. He has someone that cleans his house once a week.    He has 4 daughters     Current Outpatient Medications:  .  metoprolol tartrate (LOPRESSOR) 25 MG tablet, Take 25 mg by mouth 2 (two) times daily., Disp: , Rfl:  .  Wheat Dextrin (BENEFIBER PO), Take by mouth every morning., Disp: , Rfl:  .  loratadine (CLARITIN) 10 MG tablet, Take 1 tablet (10 mg total) daily by mouth., Disp: 30 tablet, Rfl: 0  Allergies  Allergen Reactions  . Amoxicillin Itching  . Avodart [Dutasteride]     Caused gynecomastia  . Diphenhydramine   . Hydralazine Other (See Comments)  . Lisinopril Cough  . Amlodipine Swelling and Rash     ROS  Constitutional: Negative  for fever or weight change.  Respiratory: Positive  For mild chronic  cough but no shortness of breath.   Cardiovascular: Negative for chest pain or palpitations.  Gastrointestinal: Negative for abdominal pain, no bowel changes - Benefiber   Musculoskeletal: Positive for gait problem but no  joint swelling.  Skin: Negative for rash- dry skin   Neurological: Negative for dizziness or headache.  No other specific complaints in a complete review of systems (except as listed in HPI above).  Objective  Vitals:   08/06/17 1000  BP: (!) 160/60  Pulse: 71  Resp: 14  SpO2: 98%  Weight: 163 lb 14.4 oz (74.3 kg)  Height: 5' 7"  (1.702 m)    Body mass index is 25.67 kg/m.  Physical Exam  Constitutional: Patient appears well-developed and well-nourished.  No distress.  HEENT: head atraumatic, normocephalic, pupils equal and reactive to light, neck supple, throat within normal limits Cardiovascular: Normal rate, regular rhythm and normal heart sounds.  No murmur heard. No BLE edema. Pulmonary/Chest: Effort normal and breath sounds normal. No respiratory distress. Abdominal: Soft.  There is no tenderness. Psychiatric: Patient has a normal mood and affect. behavior is normal. Judgment and thought content normal. Skin: very thin skin , senile purpura, dry skin on his legs. Some SK, no burrows noticed.    Recent Results (from the past 2160 hour(s))  Comprehensive metabolic panel     Status: Abnormal   Collection Time: 05/31/17  7:20 PM  Result Value Ref Range   Sodium 135 135 - 145 mmol/L   Potassium 4.0 3.5 - 5.1 mmol/L   Chloride 105 101 - 111 mmol/L   CO2 21 (L) 22 - 32 mmol/L   Glucose, Bld 118 (H) 65 - 99 mg/dL   BUN 34 (H) 6 - 20 mg/dL   Creatinine, Ser 1.50 (H) 0.61 - 1.24 mg/dL   Calcium 8.3 (L) 8.9 - 10.3 mg/dL   Total Protein 6.6 6.5 - 8.1 g/dL   Albumin 3.9 3.5 - 5.0 g/dL   AST 22 15 - 41 U/L   ALT 13 (L) 17 - 63 U/L   Alkaline Phosphatase 58 38 - 126 U/L   Total Bilirubin  1.5 (H) 0.3 - 1.2 mg/dL   GFR calc non Af Amer 36 (L) >60 mL/min   GFR calc Af Amer 42 (L) >60 mL/min    Comment: (NOTE) The eGFR has been calculated using the CKD EPI equation. This calculation has not been validated in all clinical situations. eGFR's persistently <60 mL/min signify possible Chronic Kidney Disease.    Anion  gap 9 5 - 15  CBC WITH DIFFERENTIAL     Status: Abnormal   Collection Time: 05/31/17  7:20 PM  Result Value Ref Range   WBC 9.8 3.8 - 10.6 K/uL   RBC 4.44 4.40 - 5.90 MIL/uL   Hemoglobin 13.0 13.0 - 18.0 g/dL   HCT 38.4 (L) 40.0 - 52.0 %   MCV 86.5 80.0 - 100.0 fL   MCH 29.2 26.0 - 34.0 pg   MCHC 33.8 32.0 - 36.0 g/dL   RDW 13.3 11.5 - 14.5 %   Platelets 182 150 - 440 K/uL   Neutrophils Relative % 92 %   Neutro Abs 9.0 (H) 1.4 - 6.5 K/uL   Lymphocytes Relative 3 %   Lymphs Abs 0.3 (L) 1.0 - 3.6 K/uL   Monocytes Relative 5 %   Monocytes Absolute 0.5 0.2 - 1.0 K/uL   Eosinophils Relative 0 %   Eosinophils Absolute 0.0 0 - 0.7 K/uL   Basophils Relative 0 %   Basophils Absolute 0.0 0 - 0.1 K/uL  Blood Culture (routine x 2)     Status: None   Collection Time: 05/31/17  7:20 PM  Result Value Ref Range   Specimen Description BLOOD BLOOD LEFT WRIST    Special Requests      BOTTLES DRAWN AEROBIC AND ANAEROBIC Blood Culture results may not be optimal due to an excessive volume of blood received in culture bottles   Culture NO GROWTH 5 DAYS    Report Status 06/05/2017 FINAL   Blood Culture (routine x 2)     Status: None   Collection Time: 05/31/17  7:20 PM  Result Value Ref Range   Specimen Description BLOOD RIGHT ANTECUBITAL    Special Requests      BOTTLES DRAWN AEROBIC AND ANAEROBIC Blood Culture adequate volume   Culture NO GROWTH 5 DAYS    Report Status 06/05/2017 FINAL   Urinalysis, Routine w reflex microscopic     Status: Abnormal   Collection Time: 05/31/17  7:20 PM  Result Value Ref Range   Color, Urine YELLOW (A) YELLOW   APPearance CLEAR (A)  CLEAR   Specific Gravity, Urine 1.021 1.005 - 1.030   pH 5.0 5.0 - 8.0   Glucose, UA NEGATIVE NEGATIVE mg/dL   Hgb urine dipstick MODERATE (A) NEGATIVE   Bilirubin Urine NEGATIVE NEGATIVE   Ketones, ur NEGATIVE NEGATIVE mg/dL   Protein, ur NEGATIVE NEGATIVE mg/dL   Nitrite NEGATIVE NEGATIVE   Leukocytes, UA NEGATIVE NEGATIVE   RBC / HPF 0-5 0 - 5 RBC/hpf   WBC, UA 0-5 0 - 5 WBC/hpf   Bacteria, UA NONE SEEN NONE SEEN   Squamous Epithelial / LPF 0-5 (A) NONE SEEN   Mucus PRESENT   Lactic acid, plasma     Status: None   Collection Time: 05/31/17  7:20 PM  Result Value Ref Range   Lactic Acid, Venous 1.2 0.5 - 1.9 mmol/L  Troponin I     Status: None   Collection Time: 05/31/17  7:20 PM  Result Value Ref Range   Troponin I <0.03 <0.03 ng/mL  Troponin I     Status: None   Collection Time: 05/31/17 10:54 PM  Result Value Ref Range   Troponin I <0.03 <0.03 ng/mL  CBC with Differential/Platelet     Status: Abnormal   Collection Time: 06/03/17 12:22 PM  Result Value Ref Range   WBC 5.9 3.8 - 10.8 Thousand/uL   RBC 4.67 4.20 - 5.80 Million/uL   Hemoglobin  13.1 (L) 13.2 - 17.1 g/dL   HCT 39.9 38.5 - 50.0 %   MCV 85.4 80.0 - 100.0 fL   MCH 28.1 27.0 - 33.0 pg   MCHC 32.8 32.0 - 36.0 g/dL   RDW 12.4 11.0 - 15.0 %   Platelets 234 140 - 400 Thousand/uL   MPV 10.2 7.5 - 12.5 fL   Neutro Abs 3,682 1,500 - 7,800 cells/uL   Lymphs Abs 1,422 850 - 3,900 cells/uL   WBC mixed population 625 200 - 950 cells/uL   Eosinophils Absolute 153 15 - 500 cells/uL   Basophils Absolute 18 0 - 200 cells/uL   Neutrophils Relative % 62.4 %   Total Lymphocyte 24.1 %   Monocytes Relative 10.6 %   Eosinophils Relative 2.6 %   Basophils Relative 0.3 %  Urine Culture     Status: None   Collection Time: 06/03/17 12:24 PM  Result Value Ref Range   MICRO NUMBER: [id number]    SPECIMEN QUALITY: ADEQUATE    Sample Source URINE, CLEAN CATCH    STATUS: FINAL    Result: No Growth      PHQ2/9: Depression  screen [facility] 2/9 01/31/2017 08/27/2016 08/22/2015 07/04/2015  Decreased Interest 0 0 0 0  Down, Depressed, Hopeless 0 0 0 0  PHQ - 2 Score 0 0 0 0    Fall Risk: Fall Risk  08/06/2017 01/31/2017 08/27/2016 08/22/2015 07/04/2015  Falls in the past year? No Yes No No No  Number falls in past yr: - 1 - - -  Injury with Fall? - Yes - - -  Comment - Right Elbow - - -     Functional Status Survey: Is the patient deaf or have difficulty hearing?: Yes Does the patient have difficulty seeing, even when wearing glasses/contacts?: No Does the patient have difficulty concentrating, remembering, or making decisions?: No Does the patient have difficulty walking or climbing stairs?: Yes Does the patient have difficulty dressing or bathing?: No Does the patient have difficulty doing errands alone such as visiting a doctor's office or shopping?: Yes    Assessment & Plan  1. Supraventricular tachycardia [facility])  Sees cardiologist, taking lopressor  2. Bilateral hearing loss, unspecified hearing loss type  Wears hearing aid  3. Benign essential HTN  Uncontrolled when he first comes in   4. Senile purpura (HCC)  On both arms  5. CKD (chronic kidney disease) stage 3, GFR 30-59 ml/min (HCC)  stable  6. Pruritus  Going on for many years, using moisturizes, reviewed labs, cannot take Benadryl, we will try Loratadine and see if helps with itching without causing side effects - loratadine (CLARITIN) 10 MG tablet; Take 1 tablet (10 mg total) daily by mouth.  Dispense: 30 tablet; Refill: 0

## 2017-11-20 IMAGING — CR DG CHEST 2V
1 series · 2 of 2 positions shown · non-contrast
Comparison: 06/21/2010

CLINICAL DATA: Weakness and chest pain after lunch today. Fever.
Shortness of breath. Diarrhea yesterday.

EXAM:
CHEST  2 VIEW

[Series 1: dg chest 2 view · 0.14mm/px · 2 of 2 slices shown]
[im 1/2]
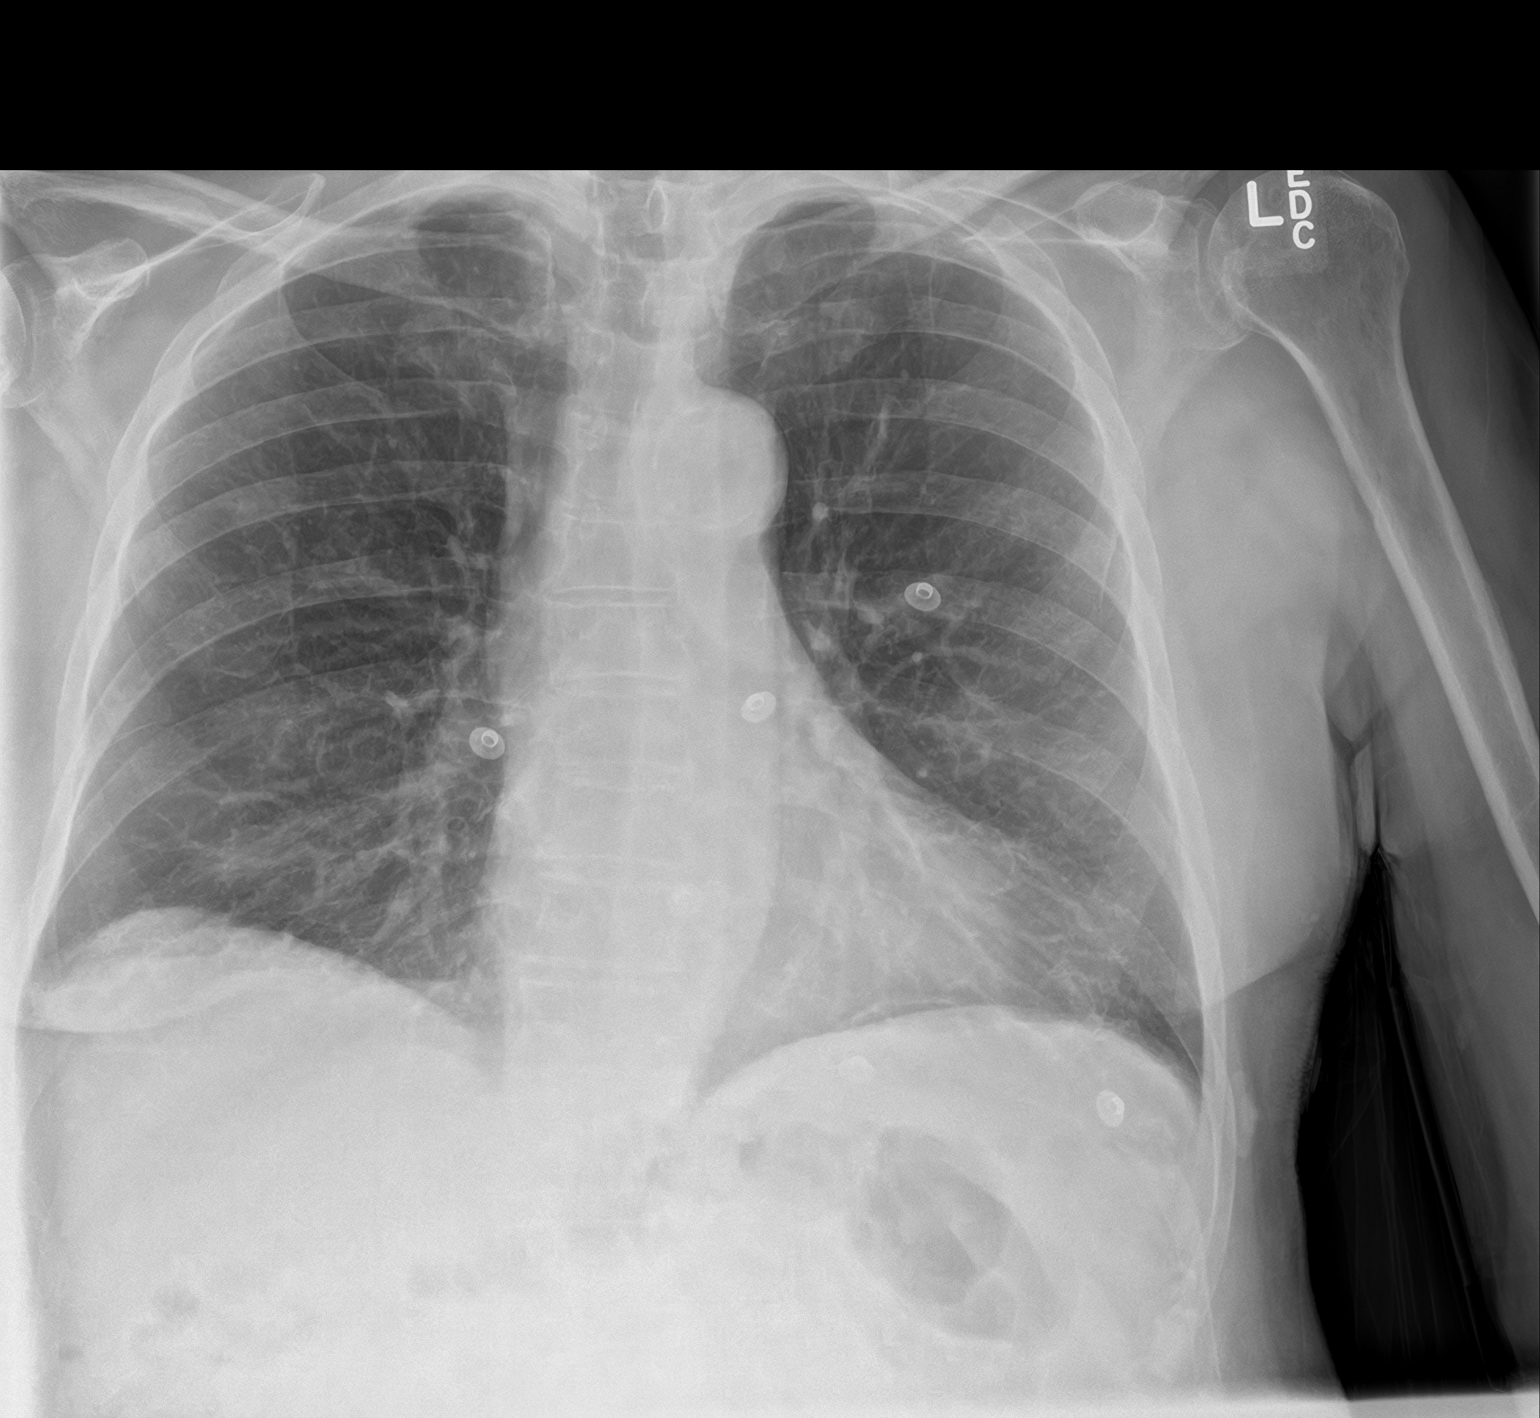
[im 2/2]
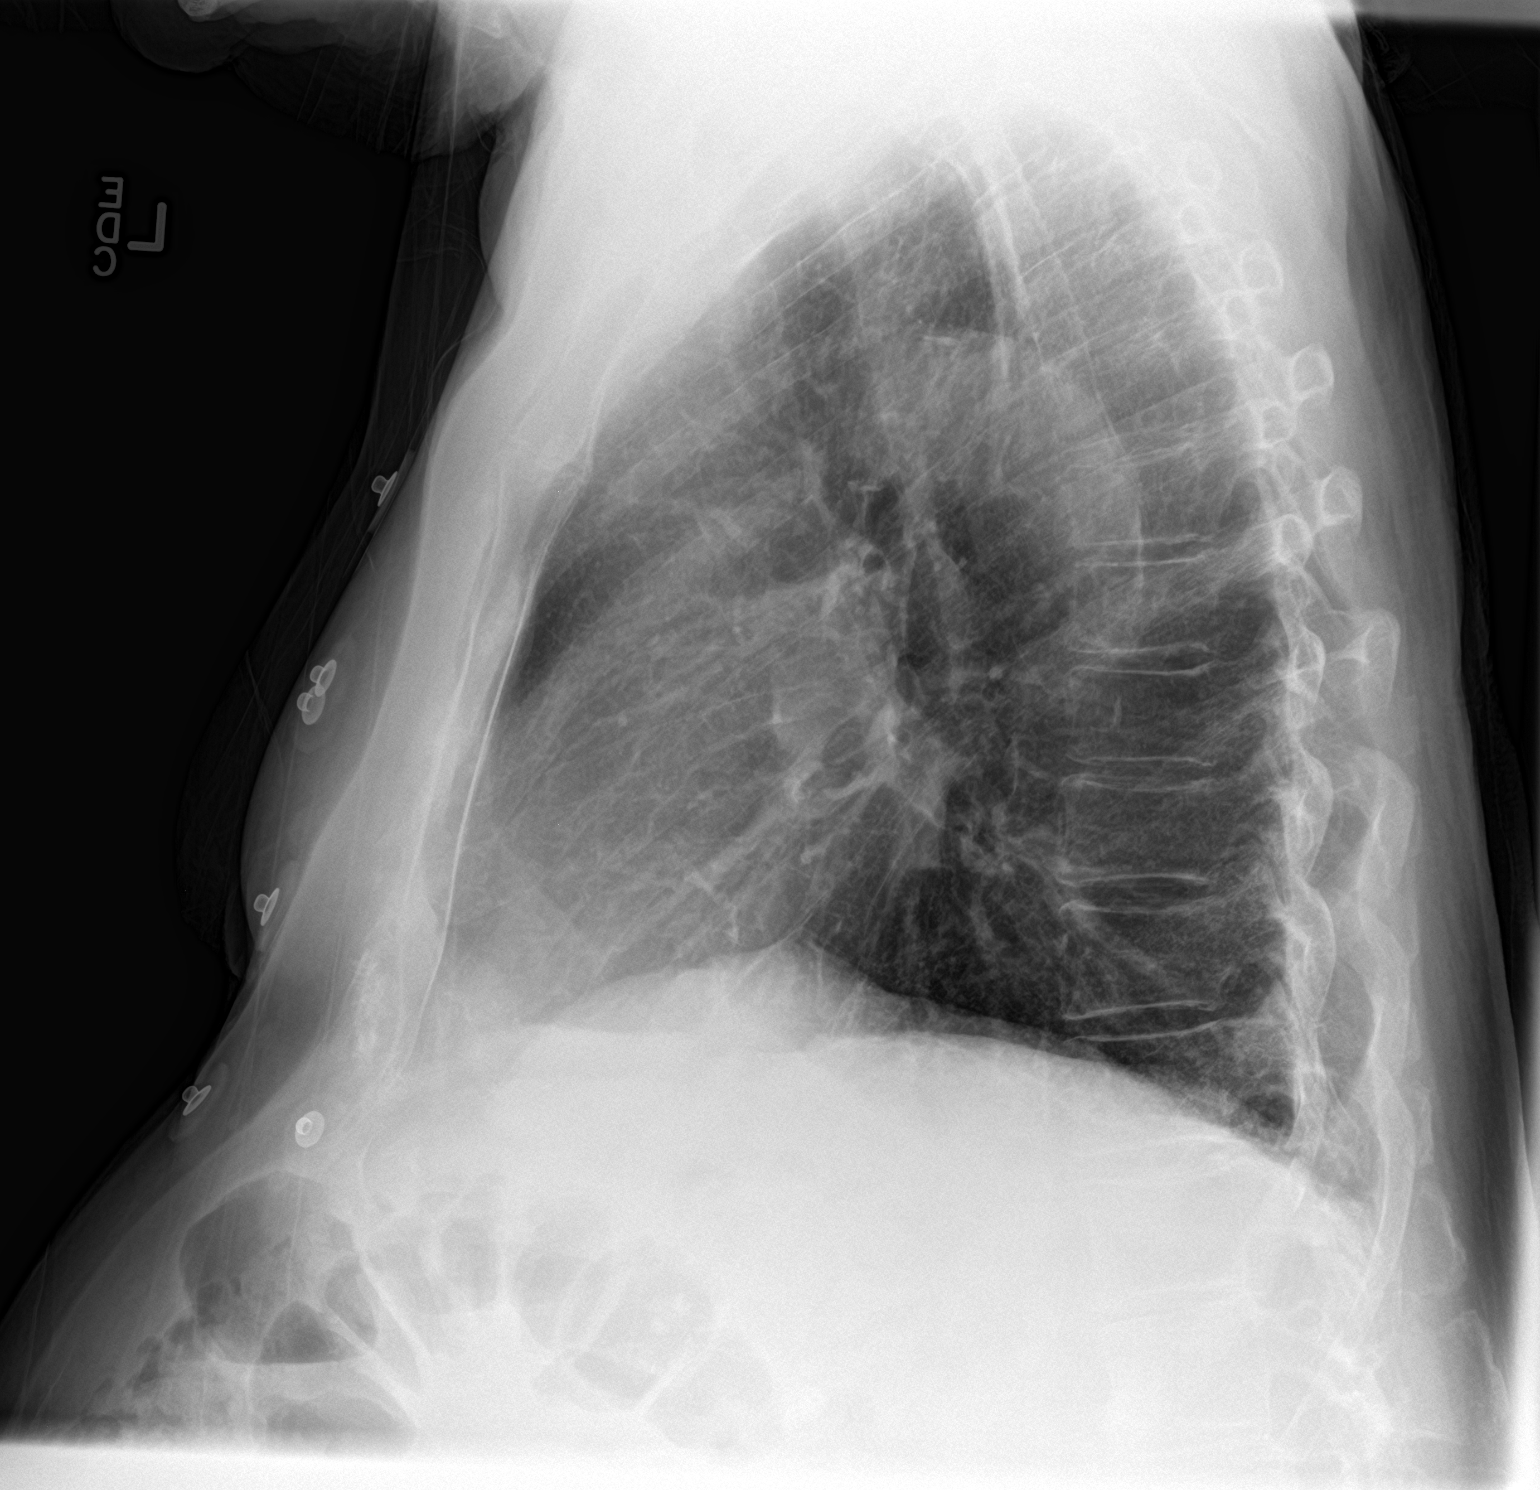

[2 of 2 positions shown; findings below may reference images not displayed]

FINDINGS: Mild hyperinflation. Slight fibrosis or linear atelectasis in the
lung bases. No airspace disease or consolidation in the lungs. No
blunting of costophrenic angles. No pneumothorax. Heart size and
pulmonary vascularity are normal. Calcified and tortuous aorta.
Degenerative changes in the spine and shoulders.
IMPRESSION: Mild hyperinflation. Slight fibrosis or linear atelectasis in the
lung bases. No evidence of active pulmonary disease.

## 2017-12-19 ENCOUNTER — Ambulatory Visit (INDEPENDENT_AMBULATORY_CARE_PROVIDER_SITE_OTHER): Payer: Medicare Other

## 2017-12-19 VITALS — BP 130/60 | HR 72 | Temp 97.8°F | Resp 12 | Ht 67.0 in | Wt 161.4 lb

## 2017-12-19 DIAGNOSIS — Z0000 Encounter for general adult medical examination without abnormal findings: Secondary | ICD-10-CM

## 2017-12-19 NOTE — Progress Notes (Addendum)
Subjective:   Mario Aguilar is a 84 y.o. male who presents for Medicare Annual/Subsequent preventive examination.  Review of Systems:  N/A Cardiac Risk Factors include: advanced age (>46men, >37 women);hypertension;male gender;sedentary lifestyle     Objective:    Vitals: BP 130/60 (BP Location: Left Arm, Patient Position: Sitting, Cuff Size: Normal)   Pulse 72   Temp 97.8 F (36.6 C) (Oral)   Resp 12   Ht 5\' 7"  (1.702 m)   Wt 161 lb 6.4 oz (73.2 kg)   SpO2 94%   BMI 25.28 kg/m   Body mass index is 25.28 kg/m.  Advanced Directives 12/19/2017 06/03/2017 05/31/2017 04/23/2017 01/31/2017 08/27/2016 03/04/2016  Does Patient Have a Medical Advance Directive? Yes Yes Yes Yes Yes Yes Yes  Type of Estate agent of [place];Living will Healthcare Power of [place];Living will Healthcare Power of [place];Living will Healthcare Power of [place];Living will Living will Healthcare Power of [place];Living will Healthcare Power of [place];Living will  Does patient want to make changes to medical advance directive? - - - - - - No - Patient declined  Copy of Healthcare Power of Attorney in Chart? No - copy requested - - No - copy requested - No - copy requested No - copy requested    Tobacco Social History   Tobacco Use  Smoking Status Former Smoker  . Years: 4.00  . Types: Pipe  . Start date: 08/27/1936  . Last attempt to quit: 08/27/1956  . Years since quitting: 61.3  Smokeless Tobacco Never Used  Tobacco Comment   smoking cessation materials not required     Counseling given: No Comment: smoking cessation materials not required   Clinical Intake:  Pre-visit preparation completed: Yes  Pain : No/denies pain   BMI - recorded: 25.67 Nutritional Status: BMI 25 -29 Overweight Nutritional Risks: None Diabetes: No  How often do you need to have someone help you when you read instructions, pamphlets, or other written materials from your doctor or pharmacy?: 1 -  Never  Interpreter Needed?: No  Information entered by :: AEversole, LPN  Hospitalizations, surgeries, and ER visits occurring within the previous 12 months:  Within the previous 12 months, pt has not been hospitalized for any conditions and has not underwent any surgeries.  Pt was seen in the ED @ [facility] on 05/31/17 for weakness and was treated by Dr. Derrill Kay. In addition to this ED visit, pt was seen in follow up by Dr. Carlynn Purl on 06/03/17.   Assessment & Plan  1. Senile purpura (HCC)  Severe on both arms  2. Supraventricular tachycardia ([facility])  Stable on beta-blocker  3. Acute neutrophilia  We will recheck levels, did not go home on antibiotics - CBC with Differential/Platelet - Urine Culture  4. Weakness  - Urine Culture  We will give rx for lift chair, it is taking him 30 minutes to get out of chair by himself.   5. Needs flu shot  - Flu vaccine HIGH DOSE PF  Past Medical History:  Diagnosis Date  . BPH (benign prostatic hypertrophy)   . Elevated PSA   . HTN (hypertension)   . Prostatitis   . Recurrent UTI    Past Surgical History:  Procedure Laterality Date  . Targis  01/13/2001   Family History  Problem Relation Age of Onset  . Diabetes Mother   . Heart disease Father   . Tuberculosis Father   . Alcohol abuse Sister   . Cancer Brother  pancreatic  . Prostate cancer Neg Hx   . Bladder Cancer Neg Hx   . Kidney cancer Neg Hx    Social History   Socioeconomic History  . Marital status: Widowed    Spouse name: Jerrye BeaversHazel  . Number of children: 4  . Years of education: Not on file  . Highest education level: Bachelor's degree (e.g., BA, AB, BS)  Occupational History  . Occupation: Retired  Engineer, productionocial Needs  . Financial resource strain: Not hard at all  . Food insecurity:    Worry: Never true    Inability: Never true  . Transportation needs:    Medical: No    Non-medical: No  Tobacco Use  . Smoking status: Former Smoker    Years: 4.00     Types: Pipe    Start date: 08/27/1936    Last attempt to quit: 08/27/1956    Years since quitting: 61.3  . Smokeless tobacco: Never Used  . Tobacco comment: smoking cessation materials not required  Substance and Sexual Activity  . Alcohol use: No    Alcohol/week: 0.0 oz  . Drug use: No  . Sexual activity: Not Currently  Lifestyle  . Physical activity:    Days per week: 7 days    Minutes per session: 10 min  . Stress: Not at all  Relationships  . Social connections:    Talks on phone: Patient refused    Gets together: Patient refused    Attends religious service: Patient refused    Active member of club or organization: Patient refused    Attends meetings of clubs or organizations: Patient refused    Relationship status: Widowed  Other Topics Concern  . Not on file  Social History Narrative  . Not on file    Outpatient Encounter Medications as of 12/19/2017  Medication Sig  . metoprolol tartrate (LOPRESSOR) 25 MG tablet Take 25 mg by mouth 2 (two) times daily.  Marland Kitchen. loratadine (CLARITIN) 10 MG tablet Take 1 tablet (10 mg total) daily by mouth. (Patient not taking: Reported on 12/19/2017)  . Wheat Dextrin (BENEFIBER PO) Take by mouth every morning.   No facility-administered encounter medications on file as of 12/19/2017.     Activities of Daily Living In your present state of health, do you have any difficulty performing the following activities: 12/19/2017 08/06/2017  Hearing? N Y  Comment B hearing aids -  Vision? N N  Comment wears eyeglasses -  Difficulty concentrating or making decisions? Y N  Comment short term memory loss -  Walking or climbing stairs? N Y  Comment does not have stairs. Has a ramp -  Dressing or bathing? N N  Doing errands, shopping? Y Y  Comment dtrs transports to appts [employer]-  Preparing Food and eating ? N -  Comment denies dentures -  Using the Toilet? N -  In the past six months, have you accidently leaked urine? N -  Do you have problems with  loss of bowel control? N -  Managing your Medications? N -  Managing your Finances? N -  Comment dtrs assists with finanaces -  Housekeeping or managing your Housekeeping? N -  Comment dtrs helps with house work -  Some recent data might be hidden    Patient Care Team: Alba CorySowles, Krichna, MD as PCP - General (Family Medicine) Lamar BlinksKowalski, Bruce J, MD as Consulting Physician (Cardiology)   Assessment:   This is a routine wellness examination for Nash-Finch CompanyLynn.  Exercise Activities and Dietary recommendations Current Exercise  Habits: Home exercise routine, Type of exercise: stretching, Time (Minutes): 15, Frequency (Times/Week): 7, Weekly Exercise (Minutes/Week): 105, Intensity: Mild, Exercise limited by: None identified  Goals    . DIET - INCREASE WATER INTAKE     Recommend to drink at least 6-8 8oz glasses of water per day.       Fall Risk Fall Risk  12/19/2017 08/06/2017 01/31/2017 08/27/2016 08/22/2015  Falls in the past year? No No Yes No No  Number falls in past yr: - - 1 - -  Injury with Fall? - - Yes - -  Comment - - Right Elbow - -  Risk for fall due to : History of fall(s);Impaired balance/gait;Impaired vision - - - -  Risk for fall due to: Comment fell in the yard d/t unlevel - - - -   Is the home free of loose throw rugs in walkways, pet beds, electrical cords, etc? Yes Adequate lighting to reduce risk of falls?  Yes In addition, does the patient have any of the following: Stairs in or around the home WITH handrails? No Grab bars in the bathroom? Yes  Shower chair or a place to sit while bathing? Yes Use of a cane, walker or w/c? Yes, rolling walker Use of an elevated toilet seat or a handicapped toilet? Yes  Timed Get Up and Go Performed: Yes. Pt ambulated 10 feet within 23 sec. Gait slow, steady and with the use of an assistive device. No intervention required at this time. Fall risk prevention has been discussed.  Community Resource Referral not required at this  time.  Depression Screen PHQ 2/9 Scores 01/31/2017 08/27/2016 08/22/2015 07/04/2015  PHQ - 2 Score 0 0 0 0    Cognitive Function     6CIT Screen 12/19/2017  What Year? 0 points  What month? 0 points  What time? 3 points  Count back from 20 0 points  Months in reverse 0 points  Repeat phrase 0 points  Total Score 3    Immunization History  Administered Date(s) Administered  . Influenza, High Dose Seasonal PF 06/03/2017  . Influenza-Unspecified 06/06/2015, 07/04/2016  . Pneumococcal Conjugate-13 10/04/2013  . Pneumococcal Polysaccharide-23 09/23/1988, 03/12/2017  . Tdap 03/04/2011, 12/29/2016  . Zoster 05/18/2008    Qualifies for Shingles Vaccine? Yes. Zostavax completed 05/18/08. Due for Shingrix vaccine. Education has been provided regarding the importance of this vaccine. Pt has been advised to call his insurance company to determine his out of pocket expense. Advised he may also receive this vaccine at his local pharmacy or Health Dept. Verbalized acceptance and understanding.  Screening Tests Health Maintenance  Topic Date Due  . TETANUS/TDAP  12/30/2026  . INFLUENZA VACCINE  Completed  . PNA vac Low Risk Adult  Completed   Cancer Screenings: Lung: Low Dose CT Chest recommended if Age 36-80 years, 30 pack-year currently smoking OR have quit w/in 15years. Patient does not qualify. Colorectal Screening: No longer required  Additional Screenings: Hepatitis B/HIV/Syphillis: Does not qualify Hepatitis C Screening: Does not qualify    Plan:  I have personally reviewed and addressed the Medicare Annual Wellness questionnaire and have noted the following in the patient's chart:  A. Medical and social history B. Use of alcohol, tobacco or illicit drugs  C. Current medications and supplements D. Functional ability and status E.  Nutritional status F.  Physical activity G. Advance directives H. List of other physicians I.  Hospitalizations, surgeries, and ER visits in  previous 12 months J.  Vitals K. Screenings such  as hearing and vision if needed, cognitive and depression L. Referrals and appointments  In addition, I have reviewed and discussed with patient certain preventive protocols, quality metrics, and best practice recommendations. A written personalized care plan for preventive services as well as general preventive health recommendations were provided to patient.  See attached scanned questionnaire for additional information.   Signed,  Deon Pilling, LPN Nurse Health Advisor  I have reviewed this encounter including the documentation in this note and/or discussed this patient with the provider, Deon Pilling, LPN. I am certifying that I agree with the content of this note as supervising physician.  Alba Cory, MD [facility] 12/21/2017, 6:41 PM

## 2017-12-19 NOTE — Patient Instructions (Signed)
Mr. Mario Aguilar , Thank you for taking time to come for your Medicare Wellness Visit. I appreciate your ongoing commitment to your health goals. Please review the following plan we discussed and let me know if I can assist you in the future.   Screening recommendations/referrals: Colorectal Screening: No longer required Lung Cancer Screening: You do not qualify for this screening Hepatitis C Screening: You do not qualify for this screening  Vision and Dental Exams: Recommended annual ophthalmology exams for early detection of glaucoma and other disorders of the eye Recommended annual dental exams for proper oral hygiene  Vaccinations: Influenza vaccine: Up to date Pneumococcal vaccine: Completed series Tdap vaccine: Up to date Shingles vaccine: Up to date    Advanced directives: Please bring a copy of your POA (Power of [place]) and/or Living Will to your next appointment.  Conditions/risks identified: Recommend to drink at least 6-8 8oz glasses of water per day.  Next appointment: Please schedule your Annual Wellness Visit with your Nurse Health Advisor in one year.  Preventive Care 7865 Years and Older, Male Preventive care refers to lifestyle choices and visits with your health care provider that can promote health and wellness. What does preventive care include?  A yearly physical exam. This is also called an annual well check.  Dental exams once or twice a year.  Routine eye exams. Ask your health care provider how often you should have your eyes checked.  Personal lifestyle choices, including:  Daily care of your teeth and gums.  Regular physical activity.  Eating a healthy diet.  Avoiding tobacco and drug use.  Limiting alcohol use.  Practicing safe sex.  Taking low doses of aspirin every day.  Taking vitamin and mineral supplements as recommended by your health care provider. What happens during an annual well check? The services and screenings done by your  health care provider during your annual well check will depend on your age, overall health, lifestyle risk factors, and family history of disease. Counseling  Your health care provider may ask you questions about your:  Alcohol use.  Tobacco use.  Drug use.  Emotional well-being.  Home and relationship well-being.  Sexual activity.  Eating habits.  History of falls.  Memory and ability to understand (cognition).  Work and work Astronomerenvironment. Screening  You may have the following tests or measurements:  Height, weight, and BMI.  Blood pressure.  Lipid and cholesterol levels. These may be checked every 5 years, or more frequently if you are over 82 years old.  Skin check.  Lung cancer screening. You may have this screening every year starting at age 82 if you have a 30-pack-year history of smoking and currently smoke or have quit within the past 15 years.  Fecal occult blood test (FOBT) of the stool. You may have this test every year starting at age 82.  Flexible sigmoidoscopy or colonoscopy. You may have a sigmoidoscopy every 5 years or a colonoscopy every 10 years starting at age 82.  Prostate cancer screening. Recommendations will vary depending on your family history and other risks.  Hepatitis C blood test.  Hepatitis B blood test.  Sexually transmitted disease (STD) testing.  Diabetes screening. This is done by checking your blood sugar (glucose) after you have not eaten for a while (fasting). You may have this done every 1-3 years.  Abdominal aortic aneurysm (AAA) screening. You may need this if you are a current or former smoker.  Osteoporosis. You may be screened starting at age 82 if  you are at high risk. Talk with your health care provider about your test results, treatment options, and if necessary, the need for more tests. Vaccines  Your health care provider may recommend certain vaccines, such as:  Influenza vaccine. This is recommended every  year.  Tetanus, diphtheria, and acellular pertussis (Tdap, Td) vaccine. You may need a Td booster every 10 years.  Zoster vaccine. You may need this after age 38.  Pneumococcal 13-valent conjugate (PCV13) vaccine. One dose is recommended after age 72.  Pneumococcal polysaccharide (PPSV23) vaccine. One dose is recommended after age 87. Talk to your health care provider about which screenings and vaccines you need and how often you need them. This information is not intended to replace advice given to you by your health care provider. Make sure you discuss any questions you have with your health care provider. Document Released: 10/06/2015 Document Revised: 05/29/2016 Document Reviewed: 07/11/2015 Elsevier Interactive Patient Education  2017 [employer] Prevention in the Home Falls can cause injuries. They can happen to people of all ages. There are many things you can do to make your home safe and to help prevent falls. What can I do on the outside of my home?  Regularly fix the edges of walkways and driveways and fix any cracks.  Remove anything that might make you trip as you walk through a door, such as a raised step or threshold.  Trim any bushes or trees on the path to your home.  Use bright outdoor lighting.  Clear any walking paths of anything that might make someone trip, such as rocks or tools.  Regularly check to see if handrails are loose or broken. Make sure that both sides of any steps have handrails.  Any raised decks and porches should have guardrails on the edges.  Have any leaves, snow, or ice cleared regularly.  Use sand or salt on walking paths during winter.  Clean up any spills in your garage right away. This includes oil or grease spills. What can I do in the bathroom?  Use night lights.  Install grab bars by the toilet and in the tub and shower. Do not use towel bars as grab bars.  Use non-skid mats or decals in the tub or shower.  If you  need to sit down in the shower, use a plastic, non-slip stool.  Keep the floor dry. Clean up any water that spills on the floor as soon as it happens.  Remove soap buildup in the tub or shower regularly.  Attach bath mats securely with double-sided non-slip rug tape.  Do not have throw rugs and other things on the floor that can make you trip. What can I do in the bedroom?  Use night lights.  Make sure that you have a light by your bed that is easy to reach.  Do not use any sheets or blankets that are too big for your bed. They should not hang down onto the floor.  Have a firm chair that has side arms. You can use this for support while you get dressed.  Do not have throw rugs and other things on the floor that can make you trip. What can I do in the kitchen?  Clean up any spills right away.  Avoid walking on wet floors.  Keep items that you use a lot in easy-to-reach places.  If you need to reach something above you, use a strong step stool that has a grab bar.  Keep electrical cords  out of the way.  Do not use floor polish or wax that makes floors slippery. If you must use wax, use non-skid floor wax.  Do not have throw rugs and other things on the floor that can make you trip. What can I do with my stairs?  Do not leave any items on the stairs.  Make sure that there are handrails on both sides of the stairs and use them. Fix handrails that are broken or loose. Make sure that handrails are as long as the stairways.  Check any carpeting to make sure that it is firmly attached to the stairs. Fix any carpet that is loose or worn.  Avoid having throw rugs at the top or bottom of the stairs. If you do have throw rugs, attach them to the floor with carpet tape.  Make sure that you have a light switch at the top of the stairs and the bottom of the stairs. If you do not have them, ask someone to add them for you. What else can I do to help prevent falls?  Wear shoes  that:  Do not have high heels.  Have rubber bottoms.  Are comfortable and fit you well.  Are closed at the toe. Do not wear sandals.  If you use a stepladder:  Make sure that it is fully opened. Do not climb a closed stepladder.  Make sure that both sides of the stepladder are locked into place.  Ask someone to hold it for you, if possible.  Clearly mark and make sure that you can see:  Any grab bars or handrails.  First and last steps.  Where the edge of each step is.  Use tools that help you move around (mobility aids) if they are needed. These include:  Canes.  Walkers.  Scooters.  Crutches.  Turn on the lights when you go into a dark area. Replace any light bulbs as soon as they burn out.  Set up your furniture so you have a clear path. Avoid moving your furniture around.  If any of your floors are uneven, fix them.  If there are any pets around you, be aware of where they are.  Review your medicines with your doctor. Some medicines can make you feel dizzy. This can increase your chance of falling. Ask your doctor what other things that you can do to help prevent falls. This information is not intended to replace advice given to you by your health care provider. Make sure you discuss any questions you have with your health care provider. Document Released: 07/06/2009 Document Revised: 02/15/2016 Document Reviewed: 10/14/2014 Elsevier Interactive Patient Education  2017 [employer].

## 2018-08-24 ENCOUNTER — Telehealth: Payer: Self-pay | Admitting: Family Medicine

## 2018-08-24 NOTE — Telephone Encounter (Signed)
Copied from CRM [id number]. Topic: General - Other >> Aug 24, 2018  3:49 PM Stephannie LiSimmons, Bobie Caris L, NT wrote: Reason for EAV:[id number]:Patients daughter called and said the patient already has the flu shot ,he went to [facility] on 07/20/18

## 2018-08-24 NOTE — Telephone Encounter (Signed)
Entered Flu Vaccine into patient chart per daughter request.

## 2019-01-14 ENCOUNTER — Ambulatory Visit: Payer: Self-pay

## 2019-02-21 ENCOUNTER — Encounter: Payer: Self-pay | Admitting: Emergency Medicine

## 2019-02-21 ENCOUNTER — Emergency Department: Payer: Medicare Other

## 2019-02-21 ENCOUNTER — Emergency Department
Admission: EM | Admit: 2019-02-21 | Discharge: 2019-02-21 | Disposition: A | Discharge status: Home / Self Care | Payer: Medicare Other | Attending: Emergency Medicine | Admitting: Emergency Medicine

## 2019-02-21 DIAGNOSIS — M549 Dorsalgia, unspecified: Secondary | ICD-10-CM | POA: Insufficient documentation

## 2019-02-21 DIAGNOSIS — Z87891 Personal history of nicotine dependence: Secondary | ICD-10-CM | POA: Diagnosis not present

## 2019-02-21 DIAGNOSIS — I10 Essential (primary) hypertension: Secondary | ICD-10-CM | POA: Diagnosis not present

## 2019-02-21 DIAGNOSIS — R109 Unspecified abdominal pain: Secondary | ICD-10-CM | POA: Diagnosis present

## 2019-02-21 LAB — HEPATIC FUNCTION PANEL
ALT: 14 U/L (ref 0–44)
AST: 25 U/L (ref 15–41)
Albumin: 4.1 g/dL (ref 3.5–5.0)
Alkaline Phosphatase: 53 U/L (ref 38–126)
Bilirubin, Direct: 0.2 mg/dL (ref 0.0–0.2)
Indirect Bilirubin: 0.7 mg/dL (ref 0.3–0.9)
Total Bilirubin: 0.9 mg/dL (ref 0.3–1.2)
Total Protein: 6.6 g/dL (ref 6.5–8.1)

## 2019-02-21 LAB — BASIC METABOLIC PANEL
Anion gap: 11 (ref 5–15)
BUN: 36 mg/dL — ABNORMAL HIGH (ref 8–23)
CO2: 21 mmol/L — ABNORMAL LOW (ref 22–32)
Calcium: 8.4 mg/dL — ABNORMAL LOW (ref 8.9–10.3)
Chloride: 104 mmol/L (ref 98–111)
Creatinine, Ser: 1.57 mg/dL — ABNORMAL HIGH (ref 0.61–1.24)
GFR calc Af Amer: 40 mL/min — ABNORMAL LOW (ref >60)
GFR calc non Af Amer: 35 mL/min — ABNORMAL LOW (ref >60)
Glucose, Bld: 101 mg/dL — ABNORMAL HIGH (ref 70–99)
Potassium: 4.1 mmol/L (ref 3.5–5.1)
Sodium: 136 mmol/L (ref 135–145)

## 2019-02-21 LAB — URINALYSIS, COMPLETE (UACMP) WITH MICROSCOPIC
Bacteria, UA: NONE SEEN
Bilirubin Urine: NEGATIVE
Glucose, UA: NEGATIVE mg/dL
Ketones, ur: NEGATIVE mg/dL
Leukocytes,Ua: NEGATIVE
Nitrite: NEGATIVE
Protein, ur: NEGATIVE mg/dL
Specific Gravity, Urine: 1.013 (ref 1.005–1.030)
pH: 5 (ref 5.0–8.0)

## 2019-02-21 LAB — CBC
HCT: 41.7 % (ref 39.0–52.0)
Hemoglobin: 14 g/dL (ref 13.0–17.0)
MCH: 29.2 pg (ref 26.0–34.0)
MCHC: 33.6 g/dL (ref 30.0–36.0)
MCV: 87.1 fL (ref 80.0–100.0)
Platelets: 202 10*3/uL (ref 150–400)
RBC: 4.79 MIL/uL (ref 4.22–5.81)
RDW: 12.4 % (ref 11.5–15.5)
WBC: 5.6 10*3/uL (ref 4.0–10.5)
nRBC: 0 % (ref 0.0–0.2)

## 2019-02-21 LAB — LIPASE, BLOOD: Lipase: 37 U/L (ref 11–51)

## 2019-02-21 MED ORDER — TRAMADOL HCL 50 MG PO TABS: 50.0000 mg | ORAL_TABLET | Freq: Four times a day (QID) | ORAL | 0 refills | Status: DC | PRN

## 2019-02-21 MED ORDER — TRAMADOL HCL 50 MG PO TABS: 50.0000 mg | ORAL_TABLET | Freq: Once | ORAL | Status: DC

## 2019-02-21 NOTE — ED Triage Notes (Signed)
Patient presents to the ED with right flank pain.  Patient's family member states, "he's been having all these muscle spasms in his back and his doctor wants his blood work checked."  Patient is very hard of hearing.  Patient is in no obvious distress at this time.

## 2019-02-21 NOTE — ED Notes (Signed)
Pt taken to CT at this time.

## 2019-02-21 NOTE — ED Provider Notes (Signed)
[facility] Emergency Department Provider Note  Time seen: 5:49 PM  I have reviewed the triage vital signs and the nursing notes.   HISTORY  Chief Complaint Flank Pain   HPI Mario Aguilar is a 85 y.o. male with a past medical history of BPH, hypertension, presents to the emergency department for right flank pain.  According to the patient since yesterday he has been experiencing intermittent pain in his right back.  Daughter is here with the patient who is helping with the history and review of systems.  No fever cough or congestion.  Pain is intermittent sharp in quality and appears to be moderate to severe lasting only seconds before resolving.  No history of kidney stones.  No reported hematuria or dysuria.  Patient went to urgent care had a negative urinalysis and was sent to the emergency department for further work-up.   Past Medical History:  Diagnosis Date  . BPH (benign prostatic hypertrophy)   . Elevated PSA   . HTN (hypertension)   . Prostatitis   . Recurrent UTI     Patient Active Problem List   Diagnosis Date Noted  . Hearing loss 08/27/2016  . Trigger finger of left hand 08/27/2016  . Senile purpura (HCC) 08/27/2016  . Gait instability 08/27/2016  . 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 Benign essential HTN 01/04/2015  . Hyperglycemia 12/07/2013  . Supraventricular tachycardia (HCC) 12/07/2013  . Heart valve disease 12/07/2013    Past Surgical History:  Procedure Laterality Date  . [place]  01/13/2001    Prior to Admission medications   Medication Sig Start Date End Date Taking? Authorizing Provider  loratadine (CLARITIN) 10 MG tablet Take 1 tablet (10 mg total) daily by mouth. Patient not taking: Reported on 12/19/2017 08/06/17   Alba Cory, MD  metoprolol tartrate (LOPRESSOR) 25 MG tablet Take 25 mg by mouth 2 (two) times daily.    [provider]  Wheat Dextrin (BENEFIBER PO) Take by mouth  every morning.    [provider]    Allergies  Allergen Reactions  . Amoxicillin Itching  . Avodart [Dutasteride]     Caused gynecomastia  . Diphenhydramine   . Hydralazine Other (See Comments)  . Lisinopril Cough  . Amlodipine Swelling and Rash    Family History  Problem Relation Age of Onset  . Diabetes Mother   . Heart disease Father   . Tuberculosis Father   . Alcohol abuse Sister   . Cancer Brother        pancreatic  . Prostate cancer Neg Hx   . Bladder Cancer Neg Hx   . Kidney cancer Neg Hx     Social History Social History   Tobacco Use  . Smoking status: Former Smoker    Years: 4.00    Types: Pipe    Start date: 08/27/1936    Last attempt to quit: 08/27/1956    Years since quitting: 62.5  . Smokeless tobacco: Never Used  . Tobacco comment: smoking cessation materials not required  Substance Use Topics  . Alcohol use: No    Alcohol/week: 0.0 standard drinks  . Drug use: No    Review of Systems Constitutional: Negative for fever. Cardiovascular: Negative for chest pain. Respiratory: Negative for shortness of breath. Gastrointestinal: Right flank pain.  Negative for vomiting or diarrhea. Genitourinary: Negative for urinary compaints Musculoskeletal: Negative for musculoskeletal complaints Neurological: Negative for headache All other ROS negative  ____________________________________________   PHYSICAL EXAM:  VITAL  SIGNS: ED Triage Vitals  Enc Vitals Group     BP 02/21/19 1657 (!) 209/85     Pulse Rate 02/21/19 1657 75     Resp 02/21/19 1657 16     Temp 02/21/19 1657 98.1 F (36.7 C)     Temp Source 02/21/19 1657 Oral     SpO2 02/21/19 1657 94 %     Weight 02/21/19 1226 150 lb (68 kg)     Height 02/21/19 1226  (1.727 m)     Head Circumference --      Peak Flow --      Pain Score 02/21/19 1225 5     Pain Loc --      Pain Edu? --      Excl. in GC? --     Constitutional: Alert and oriented. Well appearing and in no  distress. Eyes: Normal exam ENT      Head: Normocephalic and atraumatic      Mouth/Throat: Mucous membranes are moist. Cardiovascular: Normal rate, regular rhythm. Respiratory: Normal respiratory effort without tachypnea nor retractions. Breath sounds are clear  Gastrointestinal: Soft, nontender abdomen.  No rebound guarding or distention. Musculoskeletal: Nontender with normal range of motion in all extremities.  Neurologic:  Normal speech and language. No gross focal neurologic deficits Skin:  Skin is warm, dry and intact.  Psychiatric: Mood and affect are normal.   ____________________________________________   RADIOLOGY  IMPRESSION: Cholelithiasis. No CT evidence for acute cholecystitis.  Chronic pancreatitis changes with calcifications throughout the spleen. No evidence of acute pancreatitis.  Sigmoid diverticulosis.  Enlarged prostate. Urinary bladder distention. No hydronephrosis.  Aortic atherosclerosis.  ____________________________________________   INITIAL IMPRESSION / ASSESSMENT AND PLAN / ED COURSE  Pertinent labs & imaging results that were available during my care of the patient were reviewed by me and considered in my medical decision making (see chart for details).   Patient presents to the emergency department for intermittent right flank pain since yesterday.  Differential would include ureterolithiasis, urinary tract infection, pyelonephritis, radicular pain, musculoskeletal pain or muscle strain/spasm.  Patient's lab work is largely nonrevealing, largely at baseline for the patient.  Urinalysis does show a very small amount of hemoglobin on urine dipstick although no red blood cells on microscopy.  Given the patient's right flank pain we will add on hepatic function panel and lipase as precaution.  I have ordered a CT renal scan for the patient.  Patient and daughter agreeable to plan of care.  Overall the patient appears extremely well for his age, still  lives alone with home health aides.  CT scan is essentially negative for acute abnormality.  Hepatic function panel and lipase are negative as well.  Overall the patient appears extremely well with a reassuring work-up.  Highly suspect muscle spasm.  Patient is quite hypertensive, we will treat with Ultram and have the patient follow-up with his PCP.  Mario Aguilar was evaluated in Emergency Department on 02/21/2019 for the symptoms described in the history of present illness. He was evaluated in the context of the global COVID-19 pandemic, which necessitated consideration that the patient might be at risk for infection with the SARS-CoV-2 virus that causes COVID-19. Institutional protocols and algorithms that pertain to the evaluation of patients at risk for COVID-19 are in a state of rapid change based on information released by regulatory bodies including the CDC and federal and state organizations. These policies and algorithms were followed during the patient's care in the ED.  ____________________________________________  FINAL CLINICAL IMPRESSION(S) / ED DIAGNOSES  Right flank pain   Minna AntisPaduchowski, Charnelle Bergeman, MD 02/21/19 1842

## 2019-06-28 ENCOUNTER — Ambulatory Visit: Payer: Medicare Other | Admitting: Family Medicine

## 2019-06-28 ENCOUNTER — Other Ambulatory Visit: Payer: Self-pay

## 2019-06-28 ENCOUNTER — Encounter: Payer: Self-pay | Admitting: Family Medicine

## 2019-06-28 VITALS — BP 148/60 | HR 70 | Temp 96.6°F | Resp 16 | Wt 160.0 lb

## 2019-06-28 DIAGNOSIS — I10 Essential (primary) hypertension: Secondary | ICD-10-CM | POA: Diagnosis not present

## 2019-06-28 DIAGNOSIS — M7021 Olecranon bursitis, right elbow: Secondary | ICD-10-CM

## 2019-06-28 DIAGNOSIS — L299 Pruritus, unspecified: Secondary | ICD-10-CM

## 2019-06-28 DIAGNOSIS — N183 Chronic kidney disease, stage 3 unspecified: Secondary | ICD-10-CM

## 2019-06-28 DIAGNOSIS — D692 Other nonthrombocytopenic purpura: Secondary | ICD-10-CM | POA: Diagnosis not present

## 2019-06-28 DIAGNOSIS — I359 Nonrheumatic aortic valve disorder, unspecified: Secondary | ICD-10-CM

## 2019-06-28 DIAGNOSIS — I471 Supraventricular tachycardia, unspecified: Secondary | ICD-10-CM

## 2019-06-28 DIAGNOSIS — L210 Seborrhea capitis: Secondary | ICD-10-CM

## 2019-06-28 DIAGNOSIS — H938X1 Other specified disorders of right ear: Secondary | ICD-10-CM

## 2019-06-28 MED ORDER — TRIAMCINOLONE ACETONIDE 0.025 % EX LOTN: 1.0000 g | TOPICAL_LOTION | CUTANEOUS | 1 refills | Status: DC

## 2019-06-28 NOTE — Progress Notes (Signed)
Name: Mario Aguilar   MRN: [id number]    DOB: 1914-06-21   Date:06/28/2019       Progress Note  Subjective  Chief Complaint  Chief Complaint  Patient presents with  . Elbow Pain    Hit his right elbow less than 6 months ago. Has pain in it once daily that comes and goes.     HPI  He came in today with two daughters: Francina AmesSharon Wert, Karyl Toelle , they are hiring [facility] to help care for him. Currently daughter's are rotating , he still spends the nights at home by himself.   Pruritis: he has intermittent episodes of pruritis, explained that it may be dry skin, change to dove, avoid benadryl or hydroxyzine . We will try treating seborrhea or scalp since it is worse on his head  Elbow : he noticed that his elbow was very large about 2 months ago, it is not painful, no redness, seems slightly smaller in size. Right side only   Senile purpura: skin is very thin and bruises easily, reassurance given  Right ear irritation: he notices some discomfort when placing hearing aid on right ear only. No fever or chills, no drainage. Normal exam today, advised to try using vaseline chapstick on the area with a child's q tip   Hearing loss: bilateral hearing aid  HTN/heart murmur/SVT: under the care of cardiologist, no dizziness or palpitation, bp when he arrived was very high but improved before he left.   Discussed safety at home, sitter at night in the near future, use bedside commode at night or urinal   Patient Active Problem List   Diagnosis Date Noted  . Hearing loss 08/27/2016  . Trigger finger of left hand 08/27/2016  . Senile purpura (HCC) 08/27/2016  . Gait instability 08/27/2016  . 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 Benign essential HTN 01/04/2015  . Hyperglycemia 12/07/2013  . Supraventricular tachycardia (HCC) 12/07/2013  . Heart valve disease 12/07/2013    Past Surgical History:  Procedure Laterality Date  . Targis  01/13/2001    Family  History  Problem Relation Age of Onset  . Diabetes Mother   . Heart disease Father   . Tuberculosis Father   . Alcohol abuse Sister   . Cancer Brother        pancreatic  . Prostate cancer Neg Hx   . Bladder Cancer Neg Hx   . Kidney cancer Neg Hx     Social History   Socioeconomic History  . Marital status: Widowed    Spouse name: Jerrye BeaversHazel  . Number of children: 4  . Years of education: Not on file  . Highest education level: Bachelor's degree (e.g., BA, AB, BS)  Occupational History  . Occupation: Retired  Engineer, productionocial Needs  . Financial resource strain: Not hard at all  . Food insecurity    Worry: Never true    Inability: Never true  . Transportation needs    Medical: No    Non-medical: No  Tobacco Use  . Smoking status: Former Smoker    Years: 4.00    Types: Pipe    Start date: 08/27/1936    Quit date: 08/27/1956    Years since quitting: 62.8  . Smokeless tobacco: Never Used  . Tobacco comment: smoking cessation materials not required  Substance and Sexual Activity  . Alcohol use: No    Alcohol/week: 0.0 standard drinks  . Drug use: No  . Sexual activity: Not Currently  Lifestyle  .  Physical activity    Days per week: 7 days    Minutes per session: 10 min  . Stress: Not at all  Relationships  . Social Musician on phone: Patient refused    Gets together: Patient refused    Attends religious service: Patient refused    Active member of club or organization: Patient refused    Attends meetings of clubs or organizations: Patient refused    Relationship status: Widowed  . Intimate partner violence    Fear of current or ex partner: No    Emotionally abused: No    Physically abused: No    Forced sexual activity: No  Other Topics Concern  . Not on file  Social History Narrative  . Not on file     Current Outpatient Medications:  .  metoprolol tartrate (LOPRESSOR) 25 MG tablet, Take 25 mg by mouth 2 (two) times daily., Disp: , Rfl:  .  Triamcinolone  Acetonide 0.025 % LOTN, Apply 1 g topically 2 (two) times a week., Disp: 60 mL, Rfl: 1  Allergies  Allergen Reactions  . Amoxicillin Itching  . Avodart [Dutasteride]     Caused gynecomastia  . Diphenhydramine   . Hydralazine Other (See Comments)  . Lisinopril Cough  . Amlodipine Swelling and Rash    I personally reviewed active problem list, family history , social history  with the patient/caregiver today.   ROS  Constitutional: Negative for fever or weight change.  Respiratory: Negative for cough and shortness of breath.   Cardiovascular: Negative for chest pain or palpitations.  Gastrointestinal: Negative for abdominal pain, no bowel changes.  Musculoskeletal: Negative for gait problem or joint swelling.  Skin: positive   for rash.  Neurological: Negative for dizziness or headache.  No other specific complaints in a complete review of systems (except as listed in HPI above).  Objective  Vitals:   06/28/19 1353 06/28/19 1450  BP: (!) 170/90 (!) 148/60  Pulse: 70   Resp: 16   Temp: (!) 96.6 F (35.9 C)   TempSrc: Temporal   SpO2: 99%   Weight: 160 lb (72.6 kg)     Body mass index is 24.33 kg/m.  Physical Exam  Constitutional: Patient appears well-developed and well-nourished. No distress.  HEENT: head atraumatic, normocephalic, pupils equal and reactive to light, ears normal bilaterally Cardiovascular: Normal rate, regular rhythm and normal heart sounds.  No murmur heard. No BLE edema. Pulmonary/Chest: Effort normal and breath sounds normal. No respiratory distress. Abdominal: Soft.  There is no tenderness. Psychiatric: Patient has a normal mood and affect. behavior is normal. Judgment and thought content normal. Skin: senile purpura , dry scalp with Seborrhea of scalp   PHQ2/9: Depression screen [facility] 2/9 06/28/2019 01/31/2017 08/27/2016 08/22/2015 07/04/2015  Decreased Interest 0 0 0 0 0  Down, Depressed, Hopeless 0 0 0 0 0  PHQ - 2 Score 0 0 0 0 0  Altered  sleeping 0 - - - -  Tired, decreased energy 0 - - - -  Change in appetite 0 - - - -  Feeling bad or failure about yourself  0 - - - -  Trouble concentrating 0 - - - -  Moving slowly or fidgety/restless 0 - - - -  Suicidal thoughts 0 - - - -  PHQ-9 Score 0 - - - -    phq 9 is negative  Fall Risk: Fall Risk  06/28/2019 12/19/2017 08/06/2017 01/31/2017 08/27/2016  Falls in the past year? 0 No  No Yes No  Number falls in past yr: 0 - - 1 -  Injury with Fall? 0 - - Yes -  Comment - - - Right Elbow -  Risk for fall due to : - History of fall(s);Impaired balance/gait;Impaired vision - - -  Risk for fall due to: Comment - fell in the yard d/t unlevel - - -     Functional Status Survey: Is the patient deaf or have difficulty hearing?: Yes Does the patient have difficulty seeing, even when wearing glasses/contacts?: Yes Does the patient have difficulty concentrating, remembering, or making decisions?: Yes Does the patient have difficulty walking or climbing stairs?: Yes Does the patient have difficulty dressing or bathing?: Yes Does the patient have difficulty doing errands alone such as visiting a doctor's office or shopping?: Yes    Assessment & Plan  1. Seborrhea capitis  - Triamcinolone Acetonide 0.025 % LOTN; Apply 1 g topically 2 (two) times a week.  Dispense: 60 mL; Refill: 1  2. Benign essential HTN  At goal for him   3. Supraventricular tachycardia (HCC)  Asymptomatic   4. Senile purpura (HCC)  Stable  5. Stage 3 chronic kidney disease, unspecified whether stage 3a or 3b CKD  Not willing to see nephrologist   6. Pruritus  Change to dove soap  7. Aortic valvular disease   8. Olecranon bursitis of right elbow  Reassurance and also information handed out to patient and his daughters  48. Irritation of right ear  See hpi

## 2019-06-28 NOTE — Patient Instructions (Signed)
Elbow Bursitis Rehab Ask your health care provider which exercises are safe for you. Do exercises exactly as told by your health care provider and adjust them as directed. It is normal to feel mild stretching, pulling, tightness, or discomfort as you do these exercises. Stop right away if you feel sudden pain or your pain gets worse. Do not begin these exercises until told by your health care provider. Stretching and range-of-motion exercises These exercises warm up your muscles and joints and improve the movement and flexibility of your elbow. The exercises also help to relieve pain and swelling. Elbow flexion, assisted 1. Stand or sit with your left / right arm at your side. 2. Use your other hand to gently push your left / right hand toward your shoulder (assisted) while bending your elbow (flexion). 3. Hold this position for __________ seconds. 4. Slowly return your left / right arm to the starting position. Repeat __________ times. Complete this exercise __________ times a day. Elbow extension, assisted 1. Lie on your back in a comfortable position that allows you to relax your arm muscles. 2. Place a folded towel under your left / right upper arm so that your elbow and shoulder are at the same height. 3. Use your other arm to raise your left / right arm (assisted) until your elbow and hand do not rest on the bed or towel. Hold your left / right arm out straight with your other hand supporting it. 4. Let the weight of your hand straighten your elbow (extension). You should feel a stretch on the inside of your elbow. ? Keep your arm and chest muscles relaxed. ? If directed, add a small wrist weight or hand weight to increase the stretch. 5. Hold this position for __________ seconds. 6. Slowly release the stretch and return to the starting position. Repeat __________ times. Complete this exercise __________ times a day. Elbow flexion, active 1. Stand or sit with your left / right elbow bent  and your palm facing in, toward your body. 2. Bend your elbow as far as you can using only your arm muscles (active flexion). 3. Hold this position for __________ seconds. 4. Slowly return to the starting position. Repeat __________ times. Complete this exercise __________ times a day. Elbow extension, active 1. Stand or sit with your left / right elbow bent and your palm facing in, toward your body. 2. Slowly straighten your elbow using only your arm muscles (active extension). Stop when you feel a gentle stretch at the front of your arm, or when your arm is straight. 3. Hold this position for __________ seconds. 4. Slowly return to the starting position. Repeat __________ times. Complete this exercise __________ times a day. Strengthening exercises These exercises build strength and endurance in your elbow. Endurance is the ability to use your muscles for a long time, even after they get tired. Elbow flexion, isometric 1. Stand or sit with your left / right arm at waist height. Your palm should face in, toward your body. 2. Place your other hand on top of your left / right forearm. Gently push down while you resist with your left / right arm (isometric flexion). ? Use about 50% effort with both arms. You may be instructed to use more and more effort with your arms each week. ? Try not to let your left / right arm move during the exercise. 3. Hold this position for __________ seconds. 4. Let your muscles relax completely before you repeat the exercise. Repeat __________ times.   Complete this exercise __________ times a day. Elbow extension, isometric  1. Stand or sit with your left / right arm at waist height. Your palm should face in, toward your body. 2. Place your other hand on the bottom of your left / right forearm. Gently push up while you resist with your left / right arm (isometric extension). ? Use about 50% effort with both arms. You may be instructed to use more and more effort  with your arms each week. ? Try not to let your left / right arm move during the exercise. 3. Hold this position for __________ seconds. 4. Let your muscles relax completely before you repeat the exercise. Repeat __________ times. Complete this exercise __________ times a day. Biceps curls 1. Sit on a stable chair without armrests, or stand up. 2. Hold a _________ weight in your left / right hand. Your palm should face out, away from your body, at the starting position. 3. Bend your left / right elbow and move your hand up toward your shoulder. Keep your elbow at your side while you bend it. 4. Slowly return to the starting position. Repeat __________ times. Complete this exercise __________ times a day. Triceps curls  1. Lie on your back. 2. Hold a _________ weight in your left / right hand. 3. Bend your left / right elbow to a 90-degree angle (right angle), so the weight is in front of your face, over your chest, and your elbow is pointed up to the ceiling. 4. Straighten your elbow, raising your hand toward the ceiling. Use your other hand to support your left / right upper arm and to keep it still. 5. Slowly return to the starting position. Repeat __________ times. Complete this exercise __________ times a day. This information is not intended to replace advice given to you by your health care provider. Make sure you discuss any questions you have with your health care provider. Document Released: 09/09/2005 Document Revised: 12/31/2018 Document Reviewed: 09/30/2018 Elsevier Patient Education  2020 Elsevier Inc.  

## 2019-07-01 ENCOUNTER — Telehealth: Payer: Self-pay | Admitting: Family Medicine

## 2019-07-01 NOTE — Telephone Encounter (Signed)
Daughter calling because for advice because pt fell Tues, his legs gave out and he went to the floor.  Now pt is unable to get out of wheelchair on his own.  She states she is looking for advise. Pt has only got up to go to the restroom on his own. She is hoping if some orders form the doctor, then insurance will pay. She states pt will not go anywhere, unless he absolutely has to.  She states his legs are weak and not working.pt has no strength.   Pt did not bruise, or hit his head.  She is not with him right now.Ivin Booty will be going to see him tomorrow, but they need some direction and would like Dr Ancil Boozer to help if she can.

## 2019-07-02 NOTE — Telephone Encounter (Signed)
Daughter or caretaker states that she is not going to take him to [facility], because it has been three days since the fall. Patient has been up and walking around, he does have some leg weakness and he denies pain.  Please advise.

## 2019-07-05 NOTE — Telephone Encounter (Signed)
Yes they would like home health referral. They are paying [facility] and it is to expensive.

## 2019-07-06 NOTE — Telephone Encounter (Signed)
Daughter will have to call back to schedule

## 2019-07-06 NOTE — Telephone Encounter (Signed)
Please contact caregiver and schedule a virtual visit to get home health started.

## 2019-07-12 ENCOUNTER — Encounter: Payer: Self-pay | Admitting: Family Medicine

## 2019-07-12 ENCOUNTER — Ambulatory Visit (INDEPENDENT_AMBULATORY_CARE_PROVIDER_SITE_OTHER): Payer: Medicare Other | Admitting: Family Medicine

## 2019-07-12 ENCOUNTER — Other Ambulatory Visit: Payer: Self-pay

## 2019-07-12 DIAGNOSIS — R54 Age-related physical debility: Secondary | ICD-10-CM | POA: Diagnosis not present

## 2019-07-12 DIAGNOSIS — Z9181 History of falling: Secondary | ICD-10-CM

## 2019-07-12 DIAGNOSIS — R531 Weakness: Secondary | ICD-10-CM

## 2019-07-12 NOTE — Progress Notes (Signed)
Name: Mario Aguilar   MRN: [id number]    DOB: 11-May-1914   Date:07/12/2019       Progress Note  Subjective  Chief Complaint  Chief Complaint  Patient presents with  . Fall    Onset about 2 week ago/ Per daughter Ginger the fall is due his legs being weak    I connected with  Mario Aguilar on 07/12/19 at  2:40 PM EDT by telephone and verified that I am speaking with the correct person using two identifiers.  I discussed the limitations, risks, security and privacy concerns of performing an evaluation and management service by telephone and the availability of in person appointments. Staff also discussed with the patient that there may be a patient responsible charge related to this service. Patient Location: home  [facility] Additional Individuals present: Ginger - daughter   HPI  Recent Fall: he is gradually getting weaker and has been using a walker over the past year . He fell about 2 weeks ago, his legs got weak and he  fell while using his walker. He did not lose consciousness , the daughter that was at home with him called a neighbor and lift him up from the floor. He bruised his forearm, but no other pain that he has been complaining. Since the fall he is able to transfer from wheelchair to commode or wheelchair to his bed. He has been requiring more care. Daughter's rotate to care for him at home, but he is requiring more care recently. When he fell I [facility] but family decided not necessary. Ginger explained that all 4 daughters live out of town, he sometimes stays home alone, very expensive to pay for a caretaker. We will refer him to home health    Patient Active Problem List   Diagnosis Date Noted  . Hearing loss 08/27/2016  . Trigger finger of left hand 08/27/2016  . Senile purpura (HCC) 08/27/2016  . Gait instability 08/27/2016  . 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 Benign essential HTN  01/04/2015  . Hyperglycemia 12/07/2013  . Supraventricular tachycardia (HCC) 12/07/2013  . Heart valve disease 12/07/2013    Past Surgical History:  Procedure Laterality Date  . Targis  01/13/2001    Family History  Problem Relation Age of Onset  . Diabetes Mother   . Heart disease Father   . Tuberculosis Father   . Alcohol abuse Sister   . Cancer Brother        pancreatic  . Prostate cancer Neg Hx   . Bladder Cancer Neg Hx   . Kidney cancer Neg Hx     Social History   Socioeconomic History  . Marital status: Widowed    Spouse name: Jerrye BeaversHazel  . Number of children: 4  . Years of education: Not on file  . Highest education level: Bachelor's degree (e.g., BA, AB, BS)  Occupational History  . Occupation: Retired  Engineer, productionocial Needs  . Financial resource strain: Not hard at all  . Food insecurity    Worry: Never true    Inability: Never true  . Transportation needs    Medical: No    Non-medical: No  Tobacco Use  . Smoking status: Former Smoker    Years: 4.00    Types: Pipe    Start date: 08/27/1936    Quit date: 08/27/1956    Years since quitting: 62.9  . Smokeless tobacco: Never Used  . Tobacco comment: smoking cessation  materials not required  Substance and Sexual Activity  . Alcohol use: No    Alcohol/week: 0.0 standard drinks  . Drug use: No  . Sexual activity: Not Currently  Lifestyle  . Physical activity    Days per week: 7 days    Minutes per session: 10 min  . Stress: Not at all  Relationships  . Social Herbalist on phone: Patient refused    Gets together: Patient refused    Attends religious service: Patient refused    Active member of club or organization: Patient refused    Attends meetings of clubs or organizations: Patient refused    Relationship status: Widowed  . Intimate partner violence    Fear of current or ex partner: No    Emotionally abused: No    Physically abused: No    Forced sexual activity: No  Other Topics Concern  . Not  on file  Social History Narrative  . Not on file     Current Outpatient Medications:  .  metoprolol tartrate (LOPRESSOR) 25 MG tablet, Take 25 mg by mouth 2 (two) times daily., Disp: , Rfl:  .  Triamcinolone Acetonide 0.025 % LOTN, Apply 1 g topically 2 (two) times a week., Disp: 60 mL, Rfl: 1  Allergies  Allergen Reactions  . Amoxicillin Itching  . Avodart [Dutasteride]     Caused gynecomastia  . Diphenhydramine   . Hydralazine Other (See Comments)  . Lisinopril Cough  . Amlodipine Swelling and Rash    I personally reviewed active problem list, medication list, allergies, family history, social history with the patient/caregiver today.   ROS   Ten systems reviewed and is negative except as mentioned in HPI   Objective  Virtual encounter, vitals not obtained.  There is no height or weight on file to calculate BMI.  Physical Exam  Awake and alert, daughter had to do most of the talking since patient has difficulty hearing   PHQ2/9: Depression screen [facility] 2/9 07/12/2019 06/28/2019 01/31/2017 08/27/2016 08/22/2015  Decreased Interest 0 0 0 0 0  Down, Depressed, Hopeless 0 0 0 0 0  PHQ - 2 Score 0 0 0 0 0  Altered sleeping 0 0 - - -  Tired, decreased energy 0 0 - - -  Change in appetite 0 0 - - -  Feeling bad or failure about yourself  0 0 - - -  Trouble concentrating 0 0 - - -  Moving slowly or fidgety/restless 0 0 - - -  Suicidal thoughts 0 0 - - -  PHQ-9 Score 0 0 - - -  Difficult doing work/chores Not difficult at all - - - -   PHQ-2/9 Result is negative.    Fall Risk: Fall Risk  07/12/2019 06/28/2019 12/19/2017 08/06/2017 01/31/2017  Falls in the past year? 1 0 No No Yes  Number falls in past yr: 1 0 - - 1  Injury with Fall? 0 0 - - Yes  Comment - - - - Right Elbow  Risk for fall due to : - - History of fall(s);Impaired balance/gait;Impaired vision - -  Risk for fall due to: Comment - - fell in the yard d/t unlevel - -     Assessment & Plan  1. History of  recent fall  - Ambulatory referral to Home Health  2. Weakness  - Ambulatory referral to Home Health  3. Frail elderly  - Ambulatory referral to [place] that he needs 24 hour care and  medicare does not pay for that, recommended a family discussion about either placement in a facility or to rotate time with daughters . She states not interested in labs at this time. We will place referral to home health to evaluate and treat, hopefully they can bath him and prep/help with food. He also needs PT  I discussed the assessment and treatment plan with the patient. The patient was provided an opportunity to ask questions and all were answered. The patient agreed with the plan and demonstrated an understanding of the instructions.   The patient was advised to call back or seek an in-person evaluation if the symptoms worsen or if the condition fails to improve as anticipated.  I provided 15  minutes of non-face-to-face time during this encounter.  Ruel Favors, MD

## 2019-11-01 ENCOUNTER — Ambulatory Visit: Payer: Medicare Other

## 2019-12-16 ENCOUNTER — Ambulatory Visit (INDEPENDENT_AMBULATORY_CARE_PROVIDER_SITE_OTHER): Payer: Medicare Other | Admitting: Podiatry

## 2019-12-16 ENCOUNTER — Other Ambulatory Visit: Payer: Self-pay

## 2019-12-16 ENCOUNTER — Encounter: Payer: Self-pay | Admitting: Podiatry

## 2019-12-16 DIAGNOSIS — B351 Tinea unguium: Secondary | ICD-10-CM | POA: Insufficient documentation

## 2019-12-16 DIAGNOSIS — M79675 Pain in left toe(s): Secondary | ICD-10-CM | POA: Diagnosis not present

## 2019-12-16 DIAGNOSIS — M79674 Pain in right toe(s): Secondary | ICD-10-CM

## 2019-12-16 NOTE — Progress Notes (Signed)
This patient presents  to the office for evaluation and treatment of long thick painful nails .  This patient is unable to trim his own nails since the patient cannot reach the feet.  Patient says the nails are painful walking and wearing his shoes.  He presents to the office with caregiver.  He returns for preventive foot care services.  General Appearance  Alert, conversant and in no acute stress.  Vascular  Dorsalis pedis and posterior tibial  pulses are not  palpable due to swelling   bilaterally.  Capillary return is within normal limits  bilaterally. Temperature is within normal limits  Bilaterally.  Purplish feet  B/L.  Neurologic  . Deferred due to [facility].  Nails Thick disfigured discolored nails with subungual debris  from hallux to fifth toes bilaterally. No evidence of bacterial infection or drainage bilaterally.  Orthopedic  No limitations of motion  feet .  No crepitus or effusions noted.  No bony pathology or digital deformities noted.  Skin  normotropic skin with no porokeratosis noted bilaterally.  No signs of infections or ulcers noted.     Onychomycosis  Pain in toes right foot  Pain in toes left foot  Debridement  of nails  1-5  B/L with a nail nipper.  Nails were then filed using a dremel tool with no incidents.    RTC  3 months.  Iatrogenic lesion which was cauterized third toe right foot.   Helane Gunther DPM

## 2020-01-21 ENCOUNTER — Telehealth: Payer: Self-pay

## 2020-01-21 NOTE — Telephone Encounter (Signed)
Copied from CRM [id number]. Topic: General - Other >> Jan 20, 2020  3:55 PM Wyonia Hough E wrote: Reason for CRM: [facility] health can not take on Pt due to staffing issues/ please advise

## 2020-04-03 ENCOUNTER — Other Ambulatory Visit: Payer: Self-pay

## 2020-04-03 ENCOUNTER — Ambulatory Visit (INDEPENDENT_AMBULATORY_CARE_PROVIDER_SITE_OTHER): Payer: Medicare Other | Admitting: Podiatry

## 2020-04-03 ENCOUNTER — Encounter: Payer: Self-pay | Admitting: Podiatry

## 2020-04-03 DIAGNOSIS — M79674 Pain in right toe(s): Secondary | ICD-10-CM | POA: Diagnosis not present

## 2020-04-03 DIAGNOSIS — B351 Tinea unguium: Secondary | ICD-10-CM | POA: Diagnosis not present

## 2020-04-03 DIAGNOSIS — M79675 Pain in left toe(s): Secondary | ICD-10-CM | POA: Diagnosis not present

## 2020-04-03 DIAGNOSIS — R609 Edema, unspecified: Secondary | ICD-10-CM | POA: Insufficient documentation

## 2020-04-03 NOTE — Progress Notes (Signed)
This patient presents  to the office for evaluation and treatment of long thick painful nails .  This patient is unable to trim his own nails since the patient cannot reach the feet.  Patient says the nails are painful walking and wearing his shoes.  He presents to the office with daughter.  He returns for preventive foot care services.  His daughter is concerned about swelling and brings his compression socks into the office.  General Appearance  Alert, conversant and in no acute stress.  Vascular  Dorsalis pedis and posterior tibial  pulses are not  palpable due to swelling   bilaterally.  Capillary return is within normal limits  bilaterally. Temperature is within normal limits  Bilaterally.  Swelling feet  B/L only.  Neurologic  . Deferred due to [facility].  Nails Thick disfigured discolored nails with subungual debris  from hallux to fifth toes bilaterally. No evidence of bacterial infection or drainage bilaterally.  Orthopedic  No limitations of motion  feet .  No crepitus or effusions noted.  No bony pathology or digital deformities noted.  Skin  normotropic skin with no porokeratosis noted bilaterally.  No signs of infections or ulcers noted.     Onychomycosis  Pain in toes right foot  Pain in toes left foot  Debridement  of nails  1-5  B/L with a nail nipper.  Nails were then filed using a dremel tool with no incidents.    RTC  3 months.  Dispense anklet for foot swelling.   Helane Gunther DPM

## 2020-04-24 ENCOUNTER — Other Ambulatory Visit: Payer: Self-pay

## 2020-04-24 ENCOUNTER — Encounter: Payer: Self-pay | Admitting: Family Medicine

## 2020-04-24 ENCOUNTER — Ambulatory Visit: Payer: Medicare Other | Admitting: Family Medicine

## 2020-04-24 VITALS — BP 158/70 | HR 71 | Temp 97.9°F | Resp 16 | Ht 66.0 in | Wt 166.3 lb

## 2020-04-24 DIAGNOSIS — N1832 Chronic kidney disease, stage 3b: Secondary | ICD-10-CM | POA: Diagnosis not present

## 2020-04-24 DIAGNOSIS — I471 Supraventricular tachycardia: Secondary | ICD-10-CM

## 2020-04-24 DIAGNOSIS — D692 Other nonthrombocytopenic purpura: Secondary | ICD-10-CM | POA: Diagnosis not present

## 2020-04-24 DIAGNOSIS — R54 Age-related physical debility: Secondary | ICD-10-CM

## 2020-04-24 DIAGNOSIS — R0981 Nasal congestion: Secondary | ICD-10-CM

## 2020-04-24 DIAGNOSIS — I10 Essential (primary) hypertension: Secondary | ICD-10-CM | POA: Diagnosis not present

## 2020-04-24 DIAGNOSIS — R2681 Unsteadiness on feet: Secondary | ICD-10-CM

## 2020-04-24 DIAGNOSIS — L298 Other pruritus: Secondary | ICD-10-CM

## 2020-04-24 DIAGNOSIS — R531 Weakness: Secondary | ICD-10-CM

## 2020-04-24 MED ORDER — FLUTICASONE PROPIONATE 50 MCG/ACT NA SUSP: 2.0000 | Freq: Every day | NASAL | 6 refills | Status: DC

## 2020-04-24 NOTE — Progress Notes (Signed)
Name: Mario Aguilar   MRN: [id number]    DOB: 05/19/1914   Date:04/24/2020       Progress Note  Subjective  Chief Complaint  Chief Complaint  Patient presents with  . Foot Swelling    both   . Nasal Congestion  . Pruritis    eyes    HPI  Deconditioning: he has been using a wheelchair for about one year, transferring on his own, uses grab bars. He denies any pain. He is still at home and daughters plus a caregivers are rotating to care for him. Only two nights a week he stays home alone from 8 pm to 7 am. He is not able to call the daughters  Lower extremity Edema: it is going on for several weeks, he is wearing compression stockings and took some furosemide , he denies SOB, he does not have orthopnea  Eye pruritis: he was recently seen by eye doctor, but daughter is not sure who it was, advised to contact them since he had a recent visits. Since he also has nasal congestion , we will treat with flonase for now. No sneezing, or rhinorrhea.   Hearing loss: daughter had a device to amplify his ability to hear me, he has a hearing aid also   Supra ventricular tachycardia: he denies chest pain or palpitation   Senile purpura: both arms, reassurance.   Advised 24 hour care of a system to check on his when he goes to bed, also advised MVI,   Patient Active Problem List   Diagnosis Date Noted  . Swelling 04/03/2020  . Pain due to onychomycosis of toenails of both feet 12/16/2019  . Hearing loss 08/27/2016  . Trigger finger of left hand 08/27/2016  . Senile purpura (HCC) 08/27/2016  . Gait instability 08/27/2016  . 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 Benign essential HTN 01/04/2015  . Hyperglycemia 12/07/2013  . Supraventricular tachycardia (HCC) 12/07/2013  . Heart valve disease 12/07/2013    Past Surgical History:  Procedure Laterality Date  . [place]  01/13/2001    Family History  Problem Relation Age of Onset  . Diabetes Mother   . Heart  disease Father   . Tuberculosis Father   . Alcohol abuse Sister   . Cancer Brother        pancreatic  . Prostate cancer Neg Hx   . Bladder Cancer Neg Hx   . Kidney cancer Neg Hx     Social History   Tobacco Use  . Smoking status: Former Smoker    Years: 4.00    Types: Pipe    Start date: 08/27/1936    Quit date: 08/27/1956    Years since quitting: 63.7  . Smokeless tobacco: Never Used  . Tobacco comment: smoking cessation materials not required  Substance Use Topics  . Alcohol use: No    Alcohol/week: 0.0 standard drinks     Current Outpatient Medications:  .  loratadine (CLARITIN) 10 MG tablet, Take 10 mg by mouth daily as needed for allergies., Disp: , Rfl:  .  metoprolol tartrate (LOPRESSOR) 25 MG tablet, Take 25 mg by mouth 2 (two) times daily., Disp: , Rfl:  .  ascorbic acid (VITAMIN C) 500 MG tablet, Take by mouth. (Patient not taking: Reported on 04/24/2020), Disp: , Rfl:  .  furosemide (LASIX) 40 MG tablet, Take by mouth. (Patient not taking: Reported on 04/24/2020), Disp: , Rfl:  .  Multiple Vitamin (MULTI-VITAMIN) tablet, Take by mouth. (Patient not taking:  Reported on 04/24/2020), Disp: , Rfl:   Allergies  Allergen Reactions  . Amoxicillin Itching  . Avodart [Dutasteride]     Caused gynecomastia  . Diphenhydramine   . Hydralazine Other (See Comments)  . Lisinopril Cough  . Amlodipine Swelling and Rash    I personally reviewed active problem list, medication list, allergies, family history, social history, health maintenance with the patient/caregiver today.   ROS  Ten systems reviewed and is negative except as mentioned in HPI    Objective  Vitals:   04/24/20 1521  BP: (!) 158/70  Pulse: 71  Resp: 16  Temp: 97.9 F (36.6 C)  TempSrc: Temporal  SpO2: 100%  Weight: 166 lb 4.8 oz (75.4 kg)  Height: 5\' 6"  (1.676 m)    Body mass index is 26.84 kg/m.  Physical Exam  Constitutional: Patient appears well-developed and cooperative, sitting on  wheelchair .No distress.  HEENT: head atraumatic, normocephalic, lower lids are sagging, watery eyes,  ears : hearing aid , neck supple Cardiovascular: Normal rate, regular rhythm and normal heart sounds.  No murmur heard. No BLE edema. Pulmonary/Chest: Effort normal and breath sounds normal. No respiratory distress. Skin: senile purpura on both arms, very think skin  Abdominal: Soft.  There is no tenderness. Psychiatric: Patient has a normal mood and affect. behavior is normal. Judgment and thought content normal.  PHQ2/9: Depression screen [facility] 2/9 07/12/2019 06/28/2019 01/31/2017 08/27/2016 08/22/2015  Decreased Interest 0 0 0 0 0  Down, Depressed, Hopeless 0 0 0 0 0  PHQ - 2 Score 0 0 0 0 0  Altered sleeping 0 0 - - -  Tired, decreased energy 0 0 - - -  Change in appetite 0 0 - - -  Feeling bad or failure about yourself  0 0 - - -  Trouble concentrating 0 0 - - -  Moving slowly or fidgety/restless 0 0 - - -  Suicidal thoughts 0 0 - - -  PHQ-9 Score 0 0 - - -  Difficult doing work/chores Not difficult at all - - - -    phq 9 is negative   Fall Risk: Fall Risk  04/24/2020 07/12/2019 06/28/2019 12/19/2017 08/06/2017  Falls in the past year? 0 1 0 No No  Number falls in past yr: 0 1 0 - -  Injury with Fall? 0 0 0 - -  Comment - - - - -  Risk for fall due to : Impaired balance/gait;Impaired mobility - - History of fall(s);Impaired balance/gait;Impaired vision -  Risk for fall due to: Comment - - - fell in the yard d/t unlevel -     Functional Status Survey: Is the patient deaf or have difficulty hearing?: Yes Does the patient have difficulty seeing, even when wearing glasses/contacts?: No Does the patient have difficulty concentrating, remembering, or making decisions?: Yes Does the patient have difficulty walking or climbing stairs?: Yes Does the patient have difficulty dressing or bathing?: No Does the patient have difficulty doing errands alone such as visiting a doctor's office  or shopping?: Yes   Assessment & Plan  1. Senile purpura (HCC)   2. Stage 3b chronic kidney disease  Discussed labs but he is not interested   3. Benign essential HTN  bp elevated, he did not take lasix this am, explained 140's probably best, it is managed by cardiologist   4. Supraventricular tachycardia (HCC)  Sees cardiologist , rate under control with beta blocker   5. Frail elderly   6. Weakness  Secondary  to deconditioning   7. Gait instability  Using wheelchair, renewed handicap forms  8. Pruritus of eye  - fluticasone (FLONASE) 50 MCG/ACT nasal spray; Place 2 sprays into both nostrils daily.  Dispense: 16 g; Refill: 6  9. Nasal congestion  - fluticasone (FLONASE) 50 MCG/ACT nasal spray; Place 2 sprays into both nostrils daily.  Dispense: 16 g; Refill: 6

## 2020-06-26 ENCOUNTER — Other Ambulatory Visit: Payer: Self-pay

## 2020-06-26 ENCOUNTER — Ambulatory Visit (INDEPENDENT_AMBULATORY_CARE_PROVIDER_SITE_OTHER): Payer: Medicare Other | Admitting: Dermatology

## 2020-06-26 DIAGNOSIS — L578 Other skin changes due to chronic exposure to nonionizing radiation: Secondary | ICD-10-CM

## 2020-06-26 DIAGNOSIS — L219 Seborrheic dermatitis, unspecified: Secondary | ICD-10-CM

## 2020-06-26 DIAGNOSIS — L570 Actinic keratosis: Secondary | ICD-10-CM | POA: Diagnosis not present

## 2020-06-26 DIAGNOSIS — L820 Inflamed seborrheic keratosis: Secondary | ICD-10-CM

## 2020-06-26 DIAGNOSIS — L853 Xerosis cutis: Secondary | ICD-10-CM

## 2020-06-26 DIAGNOSIS — D692 Other nonthrombocytopenic purpura: Secondary | ICD-10-CM

## 2020-06-26 DIAGNOSIS — L821 Other seborrheic keratosis: Secondary | ICD-10-CM

## 2020-06-26 MED ORDER — HYDROCORTISONE 2.5 % EX CREA: TOPICAL_CREAM | CUTANEOUS | 1 refills | Status: AC

## 2020-06-26 MED ORDER — KETOCONAZOLE 2 % EX CREA: TOPICAL_CREAM | CUTANEOUS | 0 refills | Status: DC

## 2020-06-26 NOTE — Patient Instructions (Signed)

## 2020-06-26 NOTE — Progress Notes (Signed)
New Patient Visit  Subjective  Mario Aguilar is a 86 y.o. male who presents for the following: Lesions (B/L post auricular and chest - very itchy and irritated ) and itching sensation (of the arms, legs, and scalp - patient would like to discuss tx options).  The following portions of the chart were reviewed this encounter and updated as appropriate:  Tobacco  Allergies  Meds  Problems  Med Hx  Surg Hx  Fam Hx     Review of Systems:  No other skin or systemic complaints except as noted in HPI or Assessment and Plan.  Objective  Well appearing patient in no apparent distress; mood and affect are within normal limits.  A focused examination was performed including the face, scalp, trunk, and extremities . Relevant physical exam findings are noted in the Assessment and Plan.  Objective  R post auricular x 3, L post auricular x 2, scalp x 8, forehead x 5, R dorsum hand x 1 (19): Erythematous keratotic or waxy stuck-on papule or plaque.   Objective  L ear x 1, L arm x 1 (2): Erythematous thin papules/macules with gritty scale.   Objective  Chest: Pink patches with greasy scale.   Assessment & Plan  Inflamed seborrheic keratosis (19) R post auricular x 3, L post auricular x 2, scalp x 8, forehead x 5, R dorsum hand x 1  Destruction of lesion - R post auricular x 3, L post auricular x 2, scalp x 8, forehead x 5, R dorsum hand x 1 Complexity: simple   Destruction method: cryotherapy   Informed consent: discussed and consent obtained   Timeout:  patient name, date of birth, surgical site, and procedure verified Lesion destroyed using liquid nitrogen: Yes   Region frozen until ice ball extended beyond lesion: Yes   Outcome: patient tolerated procedure well with no complications   Post-procedure details: wound care instructions given    AK (actinic keratosis) (2) L ear x 1, L arm x 1  Pamphlet given on condition.  Destruction of lesion - L ear x 1, L arm x  1 Complexity: simple   Destruction method: cryotherapy   Informed consent: discussed and consent obtained   Timeout:  patient name, date of birth, surgical site, and procedure verified Lesion destroyed using liquid nitrogen: Yes   Region frozen until ice ball extended beyond lesion: Yes   Outcome: patient tolerated procedure well with no complications   Post-procedure details: wound care instructions given    Seborrheic dermatitis Chest  Start Ketoconazole 2% cream QOD on Monday, Wednesday and Friday. Start HC 2.5% cream QOD on Tuesday, Thursday, and Saturday. Pamphlet given on condition.   ketoconazole (NIZORAL) 2 % cream - Chest  hydrocortisone 2.5 % cream - Chest   Seborrheic Keratoses - pamphlet given on condition - Stuck-on, waxy, tan-brown papules and plaques  - Discussed benign etiology and prognosis. - Observe - Call for any changes  Actinic Damage - diffuse scaly erythematous macules with underlying dyspigmentation - Recommend daily broad spectrum sunscreen SPF 30+ to sun-exposed areas, reapply every 2 hours as needed.  - Call for new or changing lesions.  Purpura - Violaceous macules and patches - Benign - Related to age, sun damage and/or use of blood thinners - Observe - Can use OTC arnica containing moisturizer such as Dermend Bruise Formula if desired - Call for worsening or other concerns  Xerosis - diffuse xerotic patches - recommend gentle, hydrating skin care - gentle skin care handout  given  Return in about 3 months (around 09/26/2020) for AK and ISK recheck .  Maylene Roes, CMA, am acting as scribe for Armida Sans, MD .  Documentation: I have reviewed the above documentation for accuracy and completeness, and I agree with the above.  Armida Sans, MD

## 2020-06-27 ENCOUNTER — Encounter: Payer: Self-pay | Admitting: Dermatology

## 2020-07-10 ENCOUNTER — Other Ambulatory Visit: Payer: Self-pay

## 2020-07-10 ENCOUNTER — Ambulatory Visit (INDEPENDENT_AMBULATORY_CARE_PROVIDER_SITE_OTHER): Payer: Medicare Other | Admitting: Podiatry

## 2020-07-10 ENCOUNTER — Encounter: Payer: Self-pay | Admitting: Podiatry

## 2020-07-10 DIAGNOSIS — M79675 Pain in left toe(s): Secondary | ICD-10-CM | POA: Diagnosis not present

## 2020-07-10 DIAGNOSIS — B351 Tinea unguium: Secondary | ICD-10-CM | POA: Diagnosis not present

## 2020-07-10 DIAGNOSIS — M79674 Pain in right toe(s): Secondary | ICD-10-CM

## 2020-07-10 DIAGNOSIS — R609 Edema, unspecified: Secondary | ICD-10-CM

## 2020-07-10 NOTE — Progress Notes (Signed)
This patient presents  to the office for evaluation and treatment of long thick painful nails .  This patient is unable to trim his own nails since the patient cannot reach the feet.  Patient says the nails are painful walking and wearing his shoes.  He presents to the office with daughter.  He returns for preventive foot care services.  His daughter is concerned about swelling and brings his compression socks into the office.  General Appearance  Alert, conversant and in no acute stress.  Vascular  Dorsalis pedis and posterior tibial  pulses are not  palpable due to swelling   bilaterally.  Capillary return is within normal limits  bilaterally. Temperature is within normal limits  Bilaterally.  Swelling feet  B/L only.  Neurologic  . Deferred due to [facility].  Nails Thick disfigured discolored nails with subungual debris  from hallux to fifth toes bilaterally. No evidence of bacterial infection or drainage bilaterally.  Orthopedic  No limitations of motion  feet .  No crepitus or effusions noted.  No bony pathology or digital deformities noted.  Skin  normotropic skin with no porokeratosis noted bilaterally.  No signs of infections or ulcers noted.     Onychomycosis  Pain in toes right foot  Pain in toes left foot  Debridement  of nails  1-5  B/L with a nail nipper.  Nails were then filed using a dremel tool with no incidents.    RTC  3 months    Helane Gunther DPM

## 2020-09-12 ENCOUNTER — Telehealth: Payer: Self-pay | Admitting: Family Medicine

## 2020-09-12 NOTE — Telephone Encounter (Signed)
Copied from CRM [id number]. Topic: Medicare AWV >> Sep 12, 2020  2:14 PM Claudette Laws R wrote: Reason for CRM:  Left message for patient to call back and schedule Medicare Annual Wellness Visit (AWV) in office.   If not able to come in office, please offer to do virtually.   Last AWV  12/19/2017  Please schedule at anytime with [facility] Advisor.  40 minute appointment  Any questions, please contact me at [phone]

## 2020-09-21 ENCOUNTER — Ambulatory Visit (INDEPENDENT_AMBULATORY_CARE_PROVIDER_SITE_OTHER): Payer: Medicare Other

## 2020-09-21 DIAGNOSIS — Z0000 Encounter for general adult medical examination without abnormal findings: Secondary | ICD-10-CM | POA: Diagnosis not present

## 2020-09-21 NOTE — Progress Notes (Signed)
Subjective:   Mario Aguilar is a 86 y.o. male who presents for Medicare Annual/Subsequent preventive examination.  Virtual Visit via Telephone Note  I connected with  Mario Aguilar on 09/21/20 at  2:50 PM EST by telephone and verified that I am speaking with the correct person using two identifiers.  Location: Patient: home Provider: [facility] Persons participating in the virtual visit: patient/Nurse Health Advisor   I discussed the limitations, risks, security and privacy concerns of performing an evaluation and management service by telephone and the availability of in person appointments. The patient expressed understanding and agreed to proceed.  Interactive audio and video telecommunications were attempted between this nurse and patient, however failed, due to patient having technical difficulties OR patient did not have access to video capability.  We continued and completed visit with audio only.  Some vital signs may be absent or patient reported.   Mario Littler, LPN    Review of Systems     Cardiac Risk Factors include: advanced age (>43men, >58 women);hypertension;male gender;sedentary lifestyle     Objective:    There were no vitals filed for this visit. There is no height or weight on file to calculate BMI.  Advanced Directives 09/21/2020 02/21/2019 12/19/2017 06/03/2017 05/31/2017 04/23/2017 01/31/2017  Does Patient Have a Medical Advance Directive? Yes No Yes Yes Yes Yes Yes  Type of Estate agent of [place];Living will - Healthcare Power of [place];Living will Healthcare Power of [place];Living will Healthcare Power of [place];Living will Healthcare Power of [place];Living will Living will  Does patient want to make changes to medical advance directive? - - - - - - -  Copy of Healthcare Power of Attorney in Chart? No - copy requested - No - copy requested - - No - copy requested -  Would patient like information on creating a medical advance  directive? - No - Patient declined - - - - -    Current Medications (verified) Outpatient Encounter Medications as of 09/21/2020  Medication Sig  . hydrocortisone 2.5 % cream Apply to chest QD on Tuesday, Thursday, and Saturday  . ketoconazole (NIZORAL) 2 % cream Apply to chest QD on Monday, Wednesday, and Friday  . loratadine (CLARITIN) 10 MG tablet Take 10 mg by mouth daily as needed for allergies.   . metoprolol tartrate (LOPRESSOR) 25 MG tablet Take 1 tablet by mouth 2 (two) times daily.  . fluticasone (FLONASE) 50 MCG/ACT nasal spray Place 2 sprays into both nostrils daily. (Patient not taking: Reported on 09/21/2020)  . furosemide (LASIX) 40 MG tablet Take by mouth.  (Patient not taking: Reported on 09/21/2020)  . [DISCONTINUED] Multiple Vitamin (MULTI-VITAMIN) tablet Take by mouth.    No facility-administered encounter medications on file as of 09/21/2020.    Allergies (verified) Amoxicillin, Avodart [dutasteride], Diphenhydramine, Hydralazine, Lisinopril, and Amlodipine   History: Past Medical History:  Diagnosis Date  . BPH (benign prostatic hypertrophy)   . Elevated PSA   . HTN (hypertension)   . Prostatitis   . Recurrent UTI    Past Surgical History:  Procedure Laterality Date  . [place]  01/13/2001   Family History  Problem Relation Age of Onset  . Diabetes Mother   . Heart disease Father   . Tuberculosis Father   . Alcohol abuse Sister   . Cancer Brother        pancreatic  . Prostate cancer Neg Hx   . Bladder Cancer Neg Hx   . Kidney cancer Neg Hx  Social History   Socioeconomic History  . Marital status: Widowed    Spouse name: Jerrye BeaversHazel  . Number of children: 4  . Years of education: Not on file  . Highest education level: Bachelor's degree (e.g., BA, AB, BS)  Occupational History  . Occupation: Retired  Tobacco Use  . Smoking status: Former Smoker    Years: 4.00    Types: Pipe    Start date: 08/27/1936    Quit date: 08/27/1956    Years since  quitting: 64.1  . Smokeless tobacco: Never Used  . Tobacco comment: smoking cessation materials not required  Vaping Use  . Vaping Use: Never used  Substance and Sexual Activity  . Alcohol use: No    Alcohol/week: 0.0 standard drinks  . Drug use: No  . Sexual activity: Not Currently  Other Topics Concern  . Not on file  Social History Narrative   Pt lives alone but has 3 daughters that stay overnight and home instead care giver that comes every day.   Social Determinants of Health   Financial Resource Strain: Low Risk   . Difficulty of Paying Living Expenses: Not hard at all  Food Insecurity: No Food Insecurity  . Worried About Programme researcher, broadcasting/film/videounning Out of Food in the Last Year: Never true  . Ran Out of Food in the Last Year: Never true  Transportation Needs: No Transportation Needs  . Lack of Transportation (Medical): No  . Lack of Transportation (Non-Medical): No  Physical Activity: Inactive  . Days of Exercise per Week: 0 days  . Minutes of Exercise per Session: 0 min  Stress: No Stress Concern Present  . Feeling of Stress : Not at all  Social Connections: Socially Isolated  . Frequency of Communication with Friends and Family: Never  . Frequency of Social Gatherings with Friends and Family: More than three times a week  . Attends Religious Services: Never  . Active Member of Clubs or Organizations: No  . Attends BankerClub or Organization Meetings: Never  . Marital Status: Widowed    Tobacco Counseling Counseling given: Not Answered Comment: smoking cessation materials not required   Clinical Intake:  Pre-visit preparation completed: Yes  Pain : No/denies pain     Nutritional Risks: None Diabetes: No  How often do you need to have someone help you when you read instructions, pamphlets, or other written materials from your doctor or pharmacy?: 1 - Never    Interpreter Needed?: No  Information entered by :: Mario LittlerKasey Caprice Wasko LPN   Activities of Daily Living In your present  state of health, do you have any difficulty performing the following activities: 09/21/2020 04/24/2020  Hearing? Y Y  Comment pt wears hearing aids -  Vision? N N  Difficulty concentrating or making decisions? Malvin JohnsY Y  Walking or climbing stairs? Y Y  Dressing or bathing? Y N  Doing errands, shopping? Malvin JohnsY Y  Preparing Food and eating ? N -  Using the Toilet? N -  In the past six months, have you accidently leaked urine? N -  Do you have problems with loss of bowel control? N -  Managing your Medications? Y -  Managing your Finances? Y -  Housekeeping or managing your Housekeeping? Y -  Some recent data might be hidden    Patient Care Team: Alba CorySowles, Krichna, MD as PCP - General (Family Medicine) Lamar BlinksKowalski, Bruce J, MD as Consulting Physician (Cardiology)  Indicate any recent Medical Services you may have received from other than [facility] providers in the past  year (date may be approximate).     Assessment:   This is a routine wellness examination for Mario Aguilar.  Hearing/Vision screen  Hearing Screening   125Hz  250Hz  500Hz  1000Hz  2000Hz  3000Hz  4000Hz  6000Hz  8000Hz   Right ear:           Left ear:           Comments: Pt wears hearing aids but still very hard of hearing  Vision Screening Comments: Annual vision screenings done at [facility]  Dietary issues and exercise activities discussed: Current Exercise Habits: The patient does not participate in regular exercise at present, Exercise limited by: orthopedic condition(s)  Goals    . DIET - INCREASE WATER INTAKE     Recommend to drink at least 6-8 8oz glasses of water per day.      Depression Screen PHQ 2/9 Scores 09/21/2020 04/24/2020 07/12/2019 06/28/2019 01/31/2017 08/27/2016 08/22/2015  PHQ - 2 Score 0 - 0 0 0 0 0  PHQ- 9 Score - - 0 0 - - -  Exception Documentation - Other- indicate reason in comment box - - - - -    Fall Risk Fall Risk  09/21/2020 04/24/2020 07/12/2019 06/28/2019 12/19/2017  Falls in the past year?  1 0 1 0 No  Number falls in past yr: 0 0 1 0 -  Injury with Fall? 0 0 0 0 -  Comment - - - - -  Risk for fall due to : Impaired balance/gait;Impaired mobility;History of fall(s) Impaired balance/gait;Impaired mobility - - History of fall(s);Impaired balance/gait;Impaired vision  Risk for fall due to: Comment - - - - fell in the yard d/t unlevel  Follow up Falls prevention discussed - - - -    FALL RISK PREVENTION PERTAINING TO THE HOME:  Any stairs in or around the home? No  - ramp outside If so, are there any without handrails? No    Home free of loose throw rugs in walkways, pet beds, electrical cords, etc? Yes  Adequate lighting in your home to reduce risk of falls? Yes   ASSISTIVE DEVICES UTILIZED TO PREVENT FALLS:  Life alert? No  Use of a cane, walker or w/c? Yes  Grab bars in the bathroom? Yes  Shower chair or bench in shower? No  - pt uses wheelchair Elevated toilet seat or a handicapped toilet? Yes   TIMED UP AND GO:  Was the test performed? No . Telephonic visit.   Cognitive Function: Patient is unable to complete screening 6CIT or MMSE due to hearing difficulty.       6CIT Screen 12/19/2017  What Year? 0 points  What month? 0 points  What time? 3 points  Count back from 20 0 points  Months in reverse 0 points  Repeat phrase 0 points  Total Score 3    Immunizations Immunization History  Administered Date(s) Administered  . Influenza, High Dose Seasonal PF 06/03/2017, 06/11/2019, 06/27/2020  . Influenza-Unspecified 06/06/2015, 07/04/2016, 07/20/2018  . PFIZER SARS-COV-2 Vaccination 10/14/2019, 11/04/2019, 07/12/2020  . Pneumococcal Conjugate-13 10/04/2013  . Pneumococcal Polysaccharide-23 09/23/1988, 03/12/2017  . Tdap 03/04/2011, 12/29/2016  . Zoster 05/18/2008  . Zoster Recombinat (Shingrix) 05/19/2018, 10/14/2018    TDAP status: Up to date  Flu Vaccine status: Up to date  Pneumococcal vaccine status: Up to date  Covid-19 vaccine status:  Completed vaccines  Qualifies for Shingles Vaccine? Yes   Zostavax completed Yes   Shingrix Completed?: Yes  Screening Tests Health Maintenance  Topic Date Due  . COVID-19  Vaccine (4 - Booster for Pfizer series) 01/10/2021  . TETANUS/TDAP  12/30/2026  . INFLUENZA VACCINE  Completed  . PNA vac Low Risk Adult  Completed    Health Maintenance  There are no preventive care reminders to display for this patient.  Colorectal cancer screening: No longer required.   Lung Cancer Screening: (Low Dose CT Chest recommended if Age 53-80 years, 30 pack-year currently smoking OR have quit w/in 15years.) does not qualify.   Additional Screening:  Hepatitis C Screening: does not qualify  Vision Screening: Recommended annual ophthalmology exams for early detection of glaucoma and other disorders of the eye. Is the patient up to date with their annual eye exam?  Yes  Who is the provider or what is the name of the office in which the patient attends annual eye exams? [facility]  Dental Screening: Recommended annual dental exams for proper oral hygiene  Community Resource Referral / Chronic Care Management: CRR required this visit?  No   CCM required this visit?  No      Plan:     I have personally reviewed and noted the following in the patient's chart:   . Medical and social history . Use of alcohol, tobacco or illicit drugs  . Current medications and supplements . Functional ability and status . Nutritional status . Physical activity . Advanced directives . List of other physicians . Hospitalizations, surgeries, and ER visits in previous 12 months . Vitals . Screenings to include cognitive, depression, and falls . Referrals and appointments  In addition, I have reviewed and discussed with patient certain preventive protocols, quality metrics, and best practice recommendations. A written personalized care plan for preventive services as well as general preventive  health recommendations were provided to patient.     Mario Littler, LPN   [id number]   Nurse Notes: Patient's daughter Mario Aguilar assisted during telephonic visit due to patient's hearing difficulty. She inquired about use of compression stockings that were advised by Dr. Gwen Pounds. They do use them daily and patient has not been taking furosemide and per daughter swelling doing okay. Pt scheduled for follow up with Dr. Gwen Pounds in January.

## 2020-09-21 NOTE — Patient Instructions (Signed)
Mr. Mario Aguilar , Thank you for taking time to come for your Medicare Wellness Visit. I appreciate your ongoing commitment to your health goals. Please review the following plan we discussed and let me know if I can assist you in the future.   Screening recommendations/referrals: Colonoscopy: no longer required Recommended yearly ophthalmology/optometry visit for glaucoma screening and checkup Recommended yearly dental visit for hygiene and checkup  Vaccinations: Influenza vaccine: done 06/27/20 Pneumococcal vaccine: done 03/12/17 Tdap vaccine: done 12/29/16 Shingles vaccine: done 05/19/18 & 10/14/18   Covid-19: done 10/14/19, 11/04/19 & 07/12/20  Advanced directives: Please bring a copy of your health care power of attorney and living will to the office at your convenience.  Conditions/risks identified: Recommend drinking 6-8 glasses of water per day   Next appointment: Follow up in one year for your annual wellness visit.   Preventive Care 40 Years and Older, Male Preventive care refers to lifestyle choices and visits with your health care provider that can promote health and wellness. What does preventive care include?  A yearly physical exam. This is also called an annual well check.  Dental exams once or twice a year.  Routine eye exams. Ask your health care provider how often you should have your eyes checked.  Personal lifestyle choices, including:  Daily care of your teeth and gums.  Regular physical activity.  Eating a healthy diet.  Avoiding tobacco and drug use.  Limiting alcohol use.  Practicing safe sex.  Taking low doses of aspirin every day.  Taking vitamin and mineral supplements as recommended by your health care provider. What happens during an annual well check? The services and screenings done by your health care provider during your annual well check will depend on your age, overall health, lifestyle risk factors, and family history of disease. Counseling   Your health care provider may ask you questions about your:  Alcohol use.  Tobacco use.  Drug use.  Emotional well-being.  Home and relationship well-being.  Sexual activity.  Eating habits.  History of falls.  Memory and ability to understand (cognition).  Work and work Astronomer. Screening  You may have the following tests or measurements:  Height, weight, and BMI.  Blood pressure.  Lipid and cholesterol levels. These may be checked every 5 years, or more frequently if you are over 70 years old.  Skin check.  Lung cancer screening. You may have this screening every year starting at age 57 if you have a 30-pack-year history of smoking and currently smoke or have quit within the past 15 years.  Fecal occult blood test (FOBT) of the stool. You may have this test every year starting at age 20.  Flexible sigmoidoscopy or colonoscopy. You may have a sigmoidoscopy every 5 years or a colonoscopy every 10 years starting at age 38.  Prostate cancer screening. Recommendations will vary depending on your family history and other risks.  Hepatitis C blood test.  Hepatitis B blood test.  Sexually transmitted disease (STD) testing.  Diabetes screening. This is done by checking your blood sugar (glucose) after you have not eaten for a while (fasting). You may have this done every 1-3 years.  Abdominal aortic aneurysm (AAA) screening. You may need this if you are a current or former smoker.  Osteoporosis. You may be screened starting at age 47 if you are at high risk. Talk with your health care provider about your test results, treatment options, and if necessary, the need for more tests. Vaccines  Your health care  provider may recommend certain vaccines, such as:  Influenza vaccine. This is recommended every year.  Tetanus, diphtheria, and acellular pertussis (Tdap, Td) vaccine. You may need a Td booster every 10 years.  Zoster vaccine. You may need this after age  8.  Pneumococcal 13-valent conjugate (PCV13) vaccine. One dose is recommended after age 63.  Pneumococcal polysaccharide (PPSV23) vaccine. One dose is recommended after age 31. Talk to your health care provider about which screenings and vaccines you need and how often you need them. This information is not intended to replace advice given to you by your health care provider. Make sure you discuss any questions you have with your health care provider. Document Released: 10/06/2015 Document Revised: 05/29/2016 Document Reviewed: 07/11/2015 Elsevier Interactive Patient Education  2017 [place] Prevention in the Home Falls can cause injuries. They can happen to people of all ages. There are many things you can do to make your home safe and to help prevent falls. What can I do on the outside of my home?  Regularly fix the edges of walkways and driveways and fix any cracks.  Remove anything that might make you trip as you walk through a door, such as a raised step or threshold.  Trim any bushes or trees on the path to your home.  Use bright outdoor lighting.  Clear any walking paths of anything that might make someone trip, such as rocks or tools.  Regularly check to see if handrails are loose or broken. Make sure that both sides of any steps have handrails.  Any raised decks and porches should have guardrails on the edges.  Have any leaves, snow, or ice cleared regularly.  Use sand or salt on walking paths during winter.  Clean up any spills in your garage right away. This includes oil or grease spills. What can I do in the bathroom?  Use night lights.  Install grab bars by the toilet and in the tub and shower. Do not use towel bars as grab bars.  Use non-skid mats or decals in the tub or shower.  If you need to sit down in the shower, use a plastic, non-slip stool.  Keep the floor dry. Clean up any water that spills on the floor as soon as it happens.  Remove  soap buildup in the tub or shower regularly.  Attach bath mats securely with double-sided non-slip rug tape.  Do not have throw rugs and other things on the floor that can make you trip. What can I do in the bedroom?  Use night lights.  Make sure that you have a light by your bed that is easy to reach.  Do not use any sheets or blankets that are too big for your bed. They should not hang down onto the floor.  Have a firm chair that has side arms. You can use this for support while you get dressed.  Do not have throw rugs and other things on the floor that can make you trip. What can I do in the kitchen?  Clean up any spills right away.  Avoid walking on wet floors.  Keep items that you use a lot in easy-to-reach places.  If you need to reach something above you, use a strong step stool that has a grab bar.  Keep electrical cords out of the way.  Do not use floor polish or wax that makes floors slippery. If you must use wax, use non-skid floor wax.  Do not have throw  rugs and other things on the floor that can make you trip. What can I do with my stairs?  Do not leave any items on the stairs.  Make sure that there are handrails on both sides of the stairs and use them. Fix handrails that are broken or loose. Make sure that handrails are as long as the stairways.  Check any carpeting to make sure that it is firmly attached to the stairs. Fix any carpet that is loose or worn.  Avoid having throw rugs at the top or bottom of the stairs. If you do have throw rugs, attach them to the floor with carpet tape.  Make sure that you have a light switch at the top of the stairs and the bottom of the stairs. If you do not have them, ask someone to add them for you. What else can I do to help prevent falls?  Wear shoes that:  Do not have high heels.  Have rubber bottoms.  Are comfortable and fit you well.  Are closed at the toe. Do not wear sandals.  If you use a  stepladder:  Make sure that it is fully opened. Do not climb a closed stepladder.  Make sure that both sides of the stepladder are locked into place.  Ask someone to hold it for you, if possible.  Clearly mark and make sure that you can see:  Any grab bars or handrails.  First and last steps.  Where the edge of each step is.  Use tools that help you move around (mobility aids) if they are needed. These include:  Canes.  Walkers.  Scooters.  Crutches.  Turn on the lights when you go into a dark area. Replace any light bulbs as soon as they burn out.  Set up your furniture so you have a clear path. Avoid moving your furniture around.  If any of your floors are uneven, fix them.  If there are any pets around you, be aware of where they are.  Review your medicines with your doctor. Some medicines can make you feel dizzy. This can increase your chance of falling. Ask your doctor what other things that you can do to help prevent falls. This information is not intended to replace advice given to you by your health care provider. Make sure you discuss any questions you have with your health care provider. Document Released: 07/06/2009 Document Revised: 02/15/2016 Document Reviewed: 10/14/2014 Elsevier Interactive Patient Education  2017 [employer].

## 2020-09-27 DIAGNOSIS — R600 Localized edema: Secondary | ICD-10-CM | POA: Insufficient documentation

## 2020-10-11 ENCOUNTER — Other Ambulatory Visit: Payer: Self-pay

## 2020-10-11 ENCOUNTER — Ambulatory Visit (INDEPENDENT_AMBULATORY_CARE_PROVIDER_SITE_OTHER): Payer: Medicare Other | Admitting: Dermatology

## 2020-10-11 DIAGNOSIS — L820 Inflamed seborrheic keratosis: Secondary | ICD-10-CM | POA: Diagnosis not present

## 2020-10-11 DIAGNOSIS — L570 Actinic keratosis: Secondary | ICD-10-CM | POA: Diagnosis not present

## 2020-10-11 DIAGNOSIS — L578 Other skin changes due to chronic exposure to nonionizing radiation: Secondary | ICD-10-CM

## 2020-10-11 NOTE — Progress Notes (Signed)
° °  Follow-Up Visit   Subjective  Mario Aguilar is a 87 y.o. male who presents for the following: Follow-up (AK follow up of left ear, left arm treated with LN2. ISK follow up of bilat postauricular, scalp, forehead, right hand) and Other (Spots on his legs that get scabby.).  Accompanied by daughter who contributes to history.  The following portions of the chart were reviewed this encounter and updated as appropriate:   Tobacco   Allergies   Meds   Problems   Med Hx   Surg Hx   Fam Hx      Review of Systems:  No other skin or systemic complaints except as noted in HPI or Assessment and Plan.  Objective  Well appearing patient in no apparent distress; mood and affect are within normal limits.  A focused examination was performed including scalp, face, ears, hands, legs. Relevant physical exam findings are noted in the Assessment and Plan.  Objective  Face/ears (5): Erythematous thin papules/macules with gritty scale.   Objective  Neck, hands, postauricular areas, right lat lower leg (16): Erythematous keratotic or waxy stuck-on papule or plaque.    Assessment & Plan    Actinic Damage - chronic, secondary to cumulative UV radiation exposure/sun exposure over time - diffuse scaly erythematous macules with underlying dyspigmentation - Recommend daily broad spectrum sunscreen SPF 30+ to sun-exposed areas, reapply every 2 hours as needed.  - Call for new or changing lesions.  AK (actinic keratosis) (5) Face/ears  Destruction of lesion - Face/ears Complexity: simple   Destruction method: cryotherapy   Informed consent: discussed and consent obtained   Timeout:  patient name, date of birth, surgical site, and procedure verified Lesion destroyed using liquid nitrogen: Yes   Region frozen until ice ball extended beyond lesion: Yes   Outcome: patient tolerated procedure well with no complications   Post-procedure details: wound care instructions given    Inflamed seborrheic  keratosis (16) Neck, hands, postauricular areas, right lat lower leg  Destruction of lesion - Neck, hands, postauricular areas, right lat lower leg Complexity: simple   Destruction method: cryotherapy   Informed consent: discussed and consent obtained   Timeout:  patient name, date of birth, surgical site, and procedure verified Lesion destroyed using liquid nitrogen: Yes   Region frozen until ice ball extended beyond lesion: Yes   Outcome: patient tolerated procedure well with no complications   Post-procedure details: wound care instructions given    Return for 4-6 months, ISK follow up.  I, Joanie Coddington, CMA, am acting as scribe for Armida Sans, MD .  Documentation: I have reviewed the above documentation for accuracy and completeness, and I agree with the above.  Armida Sans, MD

## 2020-10-11 NOTE — Patient Instructions (Signed)

## 2020-10-12 ENCOUNTER — Ambulatory Visit (INDEPENDENT_AMBULATORY_CARE_PROVIDER_SITE_OTHER): Payer: Medicare Other | Admitting: Podiatry

## 2020-10-12 ENCOUNTER — Ambulatory Visit: Payer: Medicare Other | Admitting: Podiatry

## 2020-10-12 ENCOUNTER — Encounter: Payer: Self-pay | Admitting: Podiatry

## 2020-10-12 DIAGNOSIS — R609 Edema, unspecified: Secondary | ICD-10-CM | POA: Diagnosis not present

## 2020-10-12 DIAGNOSIS — M79675 Pain in left toe(s): Secondary | ICD-10-CM | POA: Diagnosis not present

## 2020-10-12 DIAGNOSIS — B351 Tinea unguium: Secondary | ICD-10-CM

## 2020-10-12 DIAGNOSIS — M79674 Pain in right toe(s): Secondary | ICD-10-CM

## 2020-10-12 NOTE — Progress Notes (Signed)
This patient presents  to the office for evaluation and treatment of long thick painful nails .  This patient is unable to trim his own nails since the patient cannot reach the feet.  Patient says the nails are painful walking and wearing his shoes.  He presents to the office with daughter.  He returns for preventive foot care services.  His daughter is concerned his nails catch onto his compression socks.  General Appearance  Alert, conversant and in no acute stress.  Vascular  Dorsalis pedis and posterior tibial  pulses are not  palpable due to swelling   bilaterally.  Capillary return is within normal limits  bilaterally. Temperature is within normal limits  Bilaterally.  Swelling feet  B/L only. Absent digital hair  B/L.  Neurologic  . Deferred due to [facility].  Nails Thick disfigured discolored nails with subungual debris  from hallux to fifth toes bilaterally. No evidence of bacterial infection or drainage bilaterally.  Orthopedic  No limitations of motion  feet .  No crepitus or effusions noted.  No bony pathology or digital deformities noted.  Skin  normotropic skin with no porokeratosis noted bilaterally.  No signs of infections or ulcers noted.     Onychomycosis  Pain in toes right foot  Pain in toes left foot  Debridement  of nails  1-5  B/L with a nail nipper.  Nails were then filed using a dremel tool with no incidents.    RTC  3 months    Helane Gunther DPM

## 2020-10-15 ENCOUNTER — Encounter: Payer: Self-pay | Admitting: Dermatology

## 2020-10-17 ENCOUNTER — Telehealth: Payer: Self-pay

## 2020-10-17 NOTE — Telephone Encounter (Signed)
There is not any better treatment for Seb Derm than what has been prescribed. Make sure each med is used regularly - one is MWF; other is TThS. If that is not helpful, would recommend ADDING Sarna lotion (over the counter anti-itch lotion) daily as needed.  There may be other reasons he is itching other than Seb Derm.  If he does not do better with Sarna Lotion, can see in a few weeks (after trying Sarna Lotion) to re-evaluate.

## 2020-10-17 NOTE — Telephone Encounter (Signed)
Patient daughter called regarding patients Hydrocortisone and Ketoconazole not helping with patient's Seb Derm. She states he itches very bad and does not like anything laying on the area, including his shirt. She said she mentioned something at last weeks appointment but never talked about it again as far as switching medication. Please advise.

## 2020-10-17 NOTE — Telephone Encounter (Signed)
Patient's daughter advised of information per Dr. Gwen Pounds.

## 2020-10-23 ENCOUNTER — Ambulatory Visit: Payer: Medicare Other | Admitting: Podiatry

## 2020-12-31 ENCOUNTER — Other Ambulatory Visit: Payer: Self-pay | Admitting: Dermatology

## 2020-12-31 DIAGNOSIS — L219 Seborrheic dermatitis, unspecified: Secondary | ICD-10-CM

## 2021-01-11 ENCOUNTER — Ambulatory Visit: Payer: Medicare Other | Admitting: Podiatry

## 2021-01-12 ENCOUNTER — Encounter: Payer: Self-pay | Admitting: Podiatry

## 2021-01-22 ENCOUNTER — Ambulatory Visit: Payer: Medicare Other | Admitting: Podiatry

## 2021-01-25 ENCOUNTER — Ambulatory Visit (INDEPENDENT_AMBULATORY_CARE_PROVIDER_SITE_OTHER): Payer: Medicare Other | Admitting: Podiatry

## 2021-01-25 ENCOUNTER — Other Ambulatory Visit: Payer: Self-pay

## 2021-01-25 ENCOUNTER — Encounter: Payer: Self-pay | Admitting: Podiatry

## 2021-01-25 DIAGNOSIS — M79674 Pain in right toe(s): Secondary | ICD-10-CM

## 2021-01-25 DIAGNOSIS — M79675 Pain in left toe(s): Secondary | ICD-10-CM | POA: Diagnosis not present

## 2021-01-25 DIAGNOSIS — R609 Edema, unspecified: Secondary | ICD-10-CM | POA: Diagnosis not present

## 2021-01-25 DIAGNOSIS — B351 Tinea unguium: Secondary | ICD-10-CM | POA: Diagnosis not present

## 2021-01-25 NOTE — Progress Notes (Signed)
This patient presents  to the office for evaluation and treatment of long thick painful nails .  This patient is unable to trim his own nails since the patient cannot reach the feet.  Patient says the nails are painful walking and wearing his shoes.  He presents to the office with his caregiver.Marland Kitchen  He returns for preventive foot care services.  His daughter is concerned his nails catch onto his compression socks.  General Appearance  Alert, conversant and in no acute stress.  Vascular  Dorsalis pedis and posterior tibial  pulses are not  palpable due to swelling   bilaterally.  Capillary return is within normal limits  bilaterally. Temperature is within normal limits  Bilaterally.  Swelling feet  B/L only. Absent digital hair  B/L.  Neurologic  . Deferred due to [facility].  Nails Thick disfigured discolored nails with subungual debris  from hallux to fifth toes bilaterally. No evidence of bacterial infection or drainage bilaterally.  Orthopedic  No limitations of motion  feet .  No crepitus or effusions noted.  No bony pathology or digital deformities noted.  Skin  normotropic skin with no porokeratosis noted bilaterally.  No signs of infections or ulcers noted.     Onychomycosis  Pain in toes right foot  Pain in toes left foot  Debridement  of nails  1-5  B/L with a nail nipper.  Nails were then filed using a dremel tool with no incidents.    RTC  3 months    Helane Gunther DPM

## 2021-03-05 ENCOUNTER — Ambulatory Visit: Payer: Medicare Other | Admitting: Dermatology

## 2021-05-07 ENCOUNTER — Ambulatory Visit (INDEPENDENT_AMBULATORY_CARE_PROVIDER_SITE_OTHER): Payer: Medicare Other | Admitting: Podiatry

## 2021-05-07 ENCOUNTER — Encounter: Payer: Self-pay | Admitting: Podiatry

## 2021-05-07 ENCOUNTER — Other Ambulatory Visit: Payer: Self-pay

## 2021-05-07 DIAGNOSIS — M79674 Pain in right toe(s): Secondary | ICD-10-CM | POA: Diagnosis not present

## 2021-05-07 DIAGNOSIS — R609 Edema, unspecified: Secondary | ICD-10-CM | POA: Diagnosis not present

## 2021-05-07 DIAGNOSIS — M79675 Pain in left toe(s): Secondary | ICD-10-CM

## 2021-05-07 DIAGNOSIS — B351 Tinea unguium: Secondary | ICD-10-CM

## 2021-05-07 NOTE — Progress Notes (Signed)
This patient presents  to the office for evaluation and treatment of long thick painful nails .  This patient is unable to trim his own nails since the patient cannot reach the feet.  Patient says the nails are painful walking and wearing his shoes.  He presents to the office with his caregiver.Marland Kitchen  He returns for preventive foot care services.  His daughter is concerned about foot swelling.  General Appearance  Alert, conversant and in no acute stress.  Vascular  Dorsalis pedis and posterior tibial  pulses are not  palpable due to swelling   bilaterally.  Capillary return is within normal limits  bilaterally. Temperature is within normal limits  Bilaterally.  Swelling feet  B/L only. Absent digital hair  B/L.  Neurologic  . Deferred due to [facility].  Nails Thick disfigured discolored nails with subungual debris  from hallux to fifth toes bilaterally. No evidence of bacterial infection or drainage bilaterally.  Orthopedic  No limitations of motion  feet .  No crepitus or effusions noted.  No bony pathology or digital deformities noted.  Skin  normotropic skin with no porokeratosis noted bilaterally.  No signs of infections or ulcers noted.     Onychomycosis  Pain in toes right foot  Pain in toes left foot  Debridement  of nails  1-5  B/L with a nail nipper.  Nails were then filed using a dremel tool with no incidents.    RTC  4  months    Helane Gunther DPM

## 2021-06-19 ENCOUNTER — Ambulatory Visit: Payer: Medicare Other | Admitting: Family Medicine

## 2021-06-19 NOTE — Progress Notes (Deleted)
Name: Mario Aguilar   MRN: [id number]    DOB: 1914-07-04   Date:06/19/2021       Progress Note  Subjective  Chief Complaint  [facility] Forms  HPI  *** Patient Active Problem List   Diagnosis Date Noted   Swelling 04/03/2020   Pain due to onychomycosis of toenails of both feet 12/16/2019   Hearing loss 08/27/2016   Trigger finger of left hand 08/27/2016   Senile purpura (HCC) 08/27/2016   Gait instability 08/27/2016   BPH with obstruction/lower urinary tract symptoms 06/14/2015   Benign essential HTN 01/04/2015   Hyperglycemia 12/07/2013   Supraventricular tachycardia (HCC) 12/07/2013   Heart valve disease 12/07/2013    Past Surgical History:  Procedure Laterality Date   [place]  01/13/2001    Family History  Problem Relation Age of Onset   Diabetes Mother    Heart disease Father    Tuberculosis Father    Alcohol abuse Sister    Cancer Brother        pancreatic   Prostate cancer Neg Hx    Bladder Cancer Neg Hx    Kidney cancer Neg Hx     Social History   Tobacco Use   Smoking status: Former    Types: Pipe    Start date: 08/27/1936    Quit date: 08/27/1956    Years since quitting: 64.8   Smokeless tobacco: Never   Tobacco comments:    smoking cessation materials not required  Substance Use Topics   Alcohol use: No    Alcohol/week: 0.0 standard drinks     Current Outpatient Medications:    fluticasone (FLONASE) 50 MCG/ACT nasal spray, Place 2 sprays into both nostrils daily., Disp: 16 g, Rfl: 6   furosemide (LASIX) 40 MG tablet, Take by mouth., Disp: , Rfl:    hydrocortisone 2.5 % cream, Apply to chest QD on Tuesday, Thursday, and Saturday, Disp: 28 g, Rfl: 1   ketoconazole (NIZORAL) 2 % cream, APPLY TO CHEST ONCE DAILY ON MONDAY, WEDNESDAY, AND FRIDAY, Disp: 60 g, Rfl: 0   loratadine (CLARITIN) 10 MG tablet, Take 10 mg by mouth daily as needed for allergies. , Disp: , Rfl:    metoprolol tartrate (LOPRESSOR) 25 MG tablet, Take 1 tablet by mouth 2 (two) times  daily., Disp: , Rfl:   Allergies  Allergen Reactions   Amoxicillin Itching   Avodart [Dutasteride]     Caused gynecomastia   Diphenhydramine    Hydralazine Other (See Comments)   Lisinopril Cough   Amlodipine Swelling and Rash    I personally reviewed {Reviewed:14835} with the patient/caregiver today.   ROS  ***  Objective  There were no vitals filed for this visit.  There is no height or weight on file to calculate BMI.  Physical Exam ***  No results found for this or any previous visit (from the past 2160 hour(s)).  Diabetic Foot Exam: Diabetic Foot Exam - Simple   No data filed    ***  PHQ2/9: Depression screen [facility] 2/9 09/21/2020 07/12/2019 06/28/2019 01/31/2017 08/27/2016  Decreased Interest 0 0 0 0 0  Down, Depressed, Hopeless 0 0 0 0 0  PHQ - 2 Score 0 0 0 0 0  Altered sleeping - 0 0 - -  Tired, decreased energy - 0 0 - -  Change in appetite - 0 0 - -  Feeling bad or failure about yourself  - 0 0 - -  Trouble concentrating - 0 0 - -  Moving slowly or  fidgety/restless - 0 0 - -  Suicidal thoughts - 0 0 - -  PHQ-9 Score - 0 0 - -  Difficult doing work/chores - Not difficult at all - - -    phq 9 is {gen pos WEX:937169} ***  Fall Risk: Fall Risk  09/21/2020 04/24/2020 07/12/2019 06/28/2019 12/19/2017  Falls in the past year? 1 0 1 0 No  Number falls in past yr: 0 0 1 0 -  Injury with Fall? 0 0 0 0 -  Comment - - - - -  Risk for fall due to : Impaired balance/gait;Impaired mobility;History of fall(s) Impaired balance/gait;Impaired mobility - - History of fall(s);Impaired balance/gait;Impaired vision  Risk for fall due to: Comment - - - - fell in the yard d/t unlevel  Follow up Falls prevention discussed - - - -   ***   Functional Status Survey:   ***   Assessment & Plan  *** There are no diagnoses linked to this encounter.

## 2021-06-21 NOTE — Progress Notes (Signed)
Name: Mario Aguilar   MRN: [id number]    DOB: 10/19/1913   Date:06/22/2021       Progress Note  Subjective  Chief Complaint  [facility] Forms  HPI  Deconditioning: he has been using a wheelchair for about two  years, transferring on his own, uses grab bars. He denies any pain. He is still at home and daughters plus a caregivers are rotating to care for him, they care a private agency giving 40 hours of care but currently applying to get assistance from the [place]. . Only two nights a week he stays home alone from 8 pm to 7 am.    Lower extremity Edema: improved, he is wearing compression stockings, he has been taking furosemide twice daily ( given by cardiologist) discussed hypokalemia but understand he has CKI and may not want to take potassium supplementation. Consider adding potassium to his diet and get labs ( daughter prefers not having lab work at this time)  , he denies SOB, he does not have orthopnea   Hearing loss: daughter had a device to amplify his ability to hear me, he has a hearing aid also , but still cannot hear much, not able to understand me with a mask    Supra ventricular tachycardia: he denies chest pain or palpitation . On low dose of beta blocker.    Senile purpura: both arms, reassurance. Stable   CKI: stage III: discussed having labs done but daughter ( Mario Aguilar ) states he is stable and not interested, no pruritis, normal urine output   Malnutrition: he has lost 30 lbs based on our scale - when compared to last year, daughter has not noticed weight loss. Discussed it may be secondary to his normal aging but to try adding calories to his diet     Patient Active Problem List   Diagnosis Date Noted   Bilateral leg edema 09/27/2020   Pain due to onychomycosis of toenails of both feet 12/16/2019   Hearing loss 08/27/2016   Trigger finger of left hand 08/27/2016   Senile purpura (HCC) 08/27/2016   Gait instability 08/27/2016   BPH with obstruction/lower urinary tract symptoms  06/14/2015   Benign essential HTN 01/04/2015   Hyperglycemia 12/07/2013   Supraventricular tachycardia (HCC) 12/07/2013   Heart valve disease 12/07/2013    Past Surgical History:  Procedure Laterality Date   [place]  01/13/2001    Family History  Problem Relation Age of Onset   Diabetes Mother    Heart disease Father    Tuberculosis Father    Alcohol abuse Sister    Cancer Brother        pancreatic   Prostate cancer Neg Hx    Bladder Cancer Neg Hx    Kidney cancer Neg Hx     Social History   Tobacco Use   Smoking status: Former    Types: Pipe    Start date: 08/27/1936    Quit date: 08/27/1956    Years since quitting: 64.8   Smokeless tobacco: Never   Tobacco comments:    smoking cessation materials not required  Substance Use Topics   Alcohol use: No    Alcohol/week: 0.0 standard drinks     Current Outpatient Medications:    loratadine (CLARITIN) 10 MG tablet, Take 10 mg by mouth daily as needed for allergies. , Disp: , Rfl:    metoprolol tartrate (LOPRESSOR) 25 MG tablet, Take 1 tablet by mouth 2 (two) times daily., Disp: , Rfl:    furosemide (LASIX)  40 MG tablet, Take by mouth., Disp: , Rfl:    hydrocortisone 2.5 % cream, Apply to chest QD on Tuesday, Thursday, and Saturday (Patient not taking: Reported on 06/22/2021), Disp: 28 g, Rfl: 1   ketoconazole (NIZORAL) 2 % cream, APPLY TO CHEST ONCE DAILY ON MONDAY, WEDNESDAY, AND FRIDAY (Patient not taking: Reported on 06/22/2021), Disp: 60 g, Rfl: 0  Allergies  Allergen Reactions   Amoxicillin Itching   Avodart [Dutasteride]     Caused gynecomastia   Diphenhydramine    Hydralazine Other (See Comments)   Lisinopril Cough   Amlodipine Swelling and Rash    I personally reviewed active problem list, medication list, allergies, family history, social history, health maintenance with the patient/caregiver today.   ROS  Ten systems reviewed and is negative except as mentioned in HPI   Objective  Vitals:    06/22/21 1258  BP: 134/62  Pulse: 67  Resp: 16  Temp: 98 F (36.7 C)  SpO2: 98%  Weight: 136 lb (61.7 kg)  Height: 5\' 6"  (1.676 m)    Body mass index is 21.95 kg/m.  Physical Exam  Constitutional: Patient appears frail.  No distress.  HEENT: head atraumatic, normocephalic, pupils equal and reactive to light, neck supple, Cardiovascular: Normal rate, regular rhythm and normal heart sounds.  No murmur heard. Trace  BLE edema. Skin: thin with senile purpura  Pulmonary/Chest: Effort normal and breath sounds normal. No respiratory distress. Abdominal: Soft.  There is no tenderness. Psychiatric: Didn't speak to me today   PHQ2/9: Depression screen [facility] 2/9 06/22/2021 09/21/2020 07/12/2019 06/28/2019 01/31/2017  Decreased Interest 0 0 0 0 0  Down, Depressed, Hopeless 0 0 0 0 0  PHQ - 2 Score 0 0 0 0 0  Altered sleeping - - 0 0 -  Tired, decreased energy - - 0 0 -  Change in appetite - - 0 0 -  Feeling bad or failure about yourself  - - 0 0 -  Trouble concentrating - - 0 0 -  Moving slowly or fidgety/restless - - 0 0 -  Suicidal thoughts - - 0 0 -  PHQ-9 Score - - 0 0 -  Difficult doing work/chores - - Not difficult at all - -    phq 9 is negative   Fall Risk: Fall Risk  06/22/2021 09/21/2020 04/24/2020 07/12/2019 06/28/2019  Falls in the past year? 0 1 0 1 0  Number falls in past yr: 0 0 0 1 0  Injury with Fall? 0 0 0 0 0  Comment - - - - -  Risk for fall due to : Impaired balance/gait;Impaired mobility Impaired balance/gait;Impaired mobility;History of fall(s) Impaired balance/gait;Impaired mobility - -  Risk for fall due to: Comment - - - - -  Follow up Falls prevention discussed Falls prevention discussed - - -      Functional Status Survey: Is the patient deaf or have difficulty hearing?: Yes Does the patient have difficulty seeing, even when wearing glasses/contacts?: No Does the patient have difficulty concentrating, remembering, or making decisions?: No Does the  patient have difficulty walking or climbing stairs?: Yes Does the patient have difficulty dressing or bathing?: No Does the patient have difficulty doing errands alone such as visiting a doctor's office or shopping?: Yes    Assessment & Plan  1. Senile purpura (HCC)  Reassurance given   2. Supraventricular tachycardia (HCC)  No problems at this time  3. Need for immunization against influenza  Given today   4. Stage  3b chronic kidney disease (HCC)  Refused labs   5. Frail elderly  Per hour scale he is malnourished, but daugther has not noticed weight loss based on the way his clothe is fitting him   6. Gait instability   Walks very shorts distances inside the house, otherwise wheelchair    7. Mild protein-calorie malnutrition (HCC)   Discussed increase in calorie intake

## 2021-06-22 ENCOUNTER — Encounter: Payer: Self-pay | Admitting: Family Medicine

## 2021-06-22 ENCOUNTER — Other Ambulatory Visit: Payer: Self-pay

## 2021-06-22 ENCOUNTER — Ambulatory Visit: Payer: Medicare Other | Admitting: Family Medicine

## 2021-06-22 VITALS — BP 134/62 | HR 67 | Temp 98.0°F | Resp 16 | Ht 66.0 in | Wt 136.0 lb

## 2021-06-22 DIAGNOSIS — D692 Other nonthrombocytopenic purpura: Secondary | ICD-10-CM

## 2021-06-22 DIAGNOSIS — Z23 Encounter for immunization: Secondary | ICD-10-CM | POA: Diagnosis not present

## 2021-06-22 DIAGNOSIS — R54 Age-related physical debility: Secondary | ICD-10-CM

## 2021-06-22 DIAGNOSIS — I471 Supraventricular tachycardia, unspecified: Secondary | ICD-10-CM

## 2021-06-22 DIAGNOSIS — N1832 Chronic kidney disease, stage 3b: Secondary | ICD-10-CM

## 2021-06-22 DIAGNOSIS — R2681 Unsteadiness on feet: Secondary | ICD-10-CM

## 2021-06-22 DIAGNOSIS — E441 Mild protein-calorie malnutrition: Secondary | ICD-10-CM

## 2021-06-22 NOTE — Patient Instructions (Signed)
Potassium Content of Foods  Potassium is a mineral found in many foods and drinks. It affects how the heartworks, and helps keep fluids and minerals balanced in the body. The amount of potassium you need each day depends on your age and any medical conditions you may have. Talk to your health care provider or dietitian abouthow much potassium you need. The following lists of foods provide the general serving size for foods and the approximate amount of potassium in each serving, listed in milligrams (mg).Actual values may vary depending on the product and how it is processed. High in potassium The following foods and beverages have 200 mg or more of potassium per serving: Apricots (raw) - 2 have 200 mg of potassium. Apricots (dry) - 5 have 200 mg of potassium. Artichoke - 1 medium has 345 mg of potassium. Avocado -  fruit has 245 mg of potassium. Banana - 1 medium fruit has 425 mg of potassium. Lima or baked beans (canned) -  cup has 280 mg of potassium. White beans (canned) -  cup has 595 mg potassium. Beef roast - 3 oz has 320 mg of potassium. Ground beef - 3 oz has 270 mg of potassium. Beets (raw or cooked) -  cup has 260 mg of potassium. Bran muffin - 2 oz has 300 mg of potassium. Broccoli (cooked) -  cup has 230 mg of potassium. Brussels sprouts -  cup has 250 mg of potassium. Cantaloupe -  cup has 215 mg of potassium. Cereal, 100% bran -  cup has 200-400 mg of potassium. Cheeseburger -1 single fast food burger has 225-400 mg of potassium. Chicken - 3 oz has 220 mg of potassium. Clams (canned) - 3 oz has 535 mg of potassium. Crab - 3 oz has 225 mg of potassium. Dates - 5 have 270 mg of potassium. Dried beans and peas -  cup has 300-475 mg of potassium. Figs (dried) - 2 have 260 mg of potassium. Fish (halibut, tuna, cod, snapper) - 3 oz has 480 mg of potassium. Fish (salmon, haddock, swordfish, perch) - 3 oz has 300 mg of potassium. Fish (tuna, canned) - 3 oz has 200 mg of  potassium. French fries (fast food) - 3 oz has 470 mg of potassium. Granola with fruit and nuts -  cup has 200 mg of potassium. Grapefruit juice -  cup has 200 mg of potassium. Honeydew melon -  cup has 200 mg of potassium. Kale (raw) - 1 cup has 300 mg of potassium. Kiwi - 1 medium fruit has 240 mg of potassium. Kohlrabi, rutabaga, parsnips -  cup has 280 mg of potassium. Lentils -  cup has 365 mg of potassium. Mango - 1 each has 325 mg of potassium. Milk (nonfat, low-fat, whole, buttermilk) - 1 cup has 350-380 mg of potassium. Milk (chocolate) - 1 cup has 420 mg of potassium Molasses - 1 Tbsp has 295 mg of potassium. Mushrooms -  cup has 280 mg of potassium. Nectarine - 1 each has 275 mg of potassium. Nuts (almonds, peanuts, hazelnuts, [place], cashew, mixed) - 1 oz has 200 mg of potassium. Nuts (pistachios) - 1 oz has 295 mg of potassium. Orange - 1 fruit has 240 mg of potassium. Orange juice -  cup has 235 mg of potassium. Papaya -  medium fruit has 390 mg of potassium. Peanut butter (chunky) - 2 Tbsp has 240 mg of potassium. Peanut butter (smooth) - 2 Tbsp has 210 mg of potassium. Pear - 1 medium (200 mg) of   potassium. Pomegranate - 1 whole fruit has 400 mg of potassium. Pomegranate juice -  cup has 215 mg of potassium. Pork - 3 oz has 350 mg of potassium. Potato chips (salted) - 1 oz has 465 mg of potassium. Potato (baked with skin) - 1 medium has 925 mg of potassium. Potato (boiled) -  cup has 255 mg of potassium. Potato (Mashed) -  cup has 330 mg of potassium. Prune juice -  cup has 370 mg of potassium. Prunes - 5 have 305 mg of potassium. Pudding (chocolate) -  cup has 230 mg of potassium. Pumpkin (canned) -  cup has 250 mg of potassium. Raisins (seedless) -  cup has 270 mg of potassium. Seeds (sunflower or pumpkin) - 1 oz has 240 mg of potassium. Soy milk - 1 cup has 300 mg of potassium. Spinach (cooked) - 1/2 cup has 420 mg of potassium. Spinach (canned)  -  cup has 370 mg of potassium. Sweet potato (baked with skin) - 1 medium has 450 mg of potassium. Swiss chard -  cup has 480 mg of potassium. Tomato or vegetable juice -  cup has 275 mg of potassium. Tomato (sauce or puree) -  cup has 400-550 mg of potassium. Tomato (raw) - 1 medium has 290 mg of potassium. Tomato (canned) -  cup has 200-300 mg of potassium. Turkey - 3 oz has 250 mg of potassium. Wheat germ - 1 oz has 250 mg of potassium. Winter squash -  cup has 250 mg of potassium. Yogurt (plain or fruited) - 6 oz has 260-435 mg of potassium. Zucchini -  cup has 220 mg of potassium. Moderate in potassium The following foods and beverages have 50-200 mg of potassium per serving: Apple - 1 fruit has 150 mg of potassium Apple juice -  cup has 150 mg of potassium Applesauce -  cup has 90 mg of potassium Apricot nectar -  cup has 140 mg of potassium Asparagus (small spears) -  cup has 155 mg of potassium Asparagus (large spears) - 6 have 155 mg of potassium Bagel (cinnamon raisin) - 1 four-inch bagel has 130 mg of potassium Bagel (egg or plain) - 1 four- inch bagel has 70 mg of potassium Beans (green) -  cup has 90 mg of potassium Beans (yellow) -  cup has 190 mg of potassium Beer, regular - 12 oz has 100 mg of potassium Beets (canned) -  cup has 125 mg of potassium Blackberries -  cup has 115 mg of potassium Blueberries -  cup has 60 mg of potassium Bread (whole wheat) - 1 slice has 70 mg of potassium Broccoli (raw) -  cup has 145 mg of potassium Cabbage -  cup has 150 mg of potassium Carrots (cooked or raw) -  cup has 180 mg of potassium Cauliflower (raw) -  cup has 150 mg of potassium Celery (raw) -  cup has 155 mg of potassium Cereal, bran flakes -  cup has 120-150 mg of potassium Cheese (cottage) -  cup has 110 mg of potassium Cherries - 10 have 150 mg of potassium Chocolate - 1 oz bar has 165 mg of potassium Coffee (brewed) - 6 oz has 90 mg of  potassium Corn -  cup or 1 ear has 195 mg of potassium Cucumbers -  cup has 80 mg of potassium Egg - 1 large egg has 60 mg of potassium Eggplant -  cup has 60 mg of potassium Endive (raw) -  cup has 80 mg of   potassium English muffin - 1 has 65 mg of potassium Fish (ocean perch) - 3 oz has 192 mg of potassium Frankfurter, beef or pork - 1 has 75 mg of potassium Fruit cocktail -  cup has 115 mg of potassium Grape juice -  cup has 170 mg of potassium Grapefruit -  fruit has 175 mg of potassium Grapes -  cup has 155 mg of potassium Greens: kale, turnip, collard -  cup has 110-150 mg of potassium Ice cream or frozen yogurt (chocolate) -  cup has 175 mg of potassium Ice cream or frozen yogurt (vanilla) -  cup has 120-150 mg of potassium Lemons, limes - 1 each has 80 mg of potassium Lettuce - 1 cup has 100 mg of potassium Mixed vegetables -  cup has 150 mg of potassium Mushrooms, raw -  cup has 110 mg of potassium Nuts (walnuts, pecans, or macadamia) - 1 oz has 125 mg of potassium Oatmeal -  cup has 80 mg of potassium Okra -  cup has 110 mg of potassium Onions -  cup has 120 mg of potassium Peach - 1 has 185 mg of potassium Peaches (canned) -  cup has 120 mg of potassium Pears (canned) -  cup has 120 mg of potassium Peas, green (frozen) -  cup has 90 mg of potassium Peppers (Green) -  cup has 130 mg of potassium Peppers (Red) -  cup has 160 mg of potassium Pineapple juice -  cup has 165 mg of potassium Pineapple (fresh or canned) -  cup has 100 mg of potassium Plums - 1 has 105 mg of potassium Pudding, vanilla -  cup has 150 mg of potassium Raspberries -  cup has 90 mg of potassium Rhubarb -  cup has 115 mg of potassium Rice, wild -  cup has 80 mg of potassium Shrimp - 3 oz has 155 mg of potassium Spinach (raw) - 1 cup has 170 mg of potassium Strawberries -  cup has 125 mg of potassium Summer squash -  cup has 175-200 mg of potassium Swiss chard (raw) -  1 cup has 135 mg of potassium Tangerines - 1 fruit has 140 mg of potassium Tea, brewed - 6 oz has 65 mg of potassium Turnips -  cup has 140 mg of potassium Watermelon -  cup has 85 mg of potassium Wine (Red, table) - 5 oz has 180 mg of potassium Wine (White, table) - 5 oz 100 mg of potassium Low in potassium The following foods and beverages have less than 50 mg of potassium per serving. Bread (white) - 1 slice has 30 mg of potassium Carbonated beverages - 12 oz has less than 5 mg of potassium Cheese - 1 oz has 20-30 mg of potassium Cranberries -  cup has 45 mg of potassium Cranberry juice cocktail -  cup has 20 mg of potassium Fats and oils - 1 Tbsp has less than 5 mg of potassium Hummus - 1 Tbsp has 32 mg of potassium Nectar (papaya, mango, or pear) -  cup has 35 mg of potassium Rice (white or brown) -  cup has 50 mg of potassium Spaghetti or macaroni (cooked) -  cup has 30 mg of potassium Tortilla, flour or corn - 1 has 50 mg of potassium Waffle - 1 four-inch waffle has 50 mg of potassium Water chestnuts -  cup has 40 mg of potassium Summary Potassium is a mineral found in many foods and drinks. It affects how the heart works, and   helps keep fluids and minerals balanced in the body. The amount of potassium you need each day depends on your age and any existing medical conditions you may have. Your health care provider or dietitian may recommend an amount of potassium that you should have each day. This information is not intended to replace advice given to you by your health care provider. Make sure you discuss any questions you have with your healthcare provider. Document Revised: 08/22/2017 Document Reviewed: 12/04/2016 Elsevier Patient Education  2022 [employer] Inc.  

## 2021-08-25 ENCOUNTER — Other Ambulatory Visit: Payer: Self-pay

## 2021-08-25 ENCOUNTER — Encounter: Payer: Self-pay | Admitting: Emergency Medicine

## 2021-08-25 ENCOUNTER — Emergency Department
Admission: EM | Admit: 2021-08-25 | Discharge: 2021-08-25 | Disposition: A | Discharge status: Home / Self Care | Payer: Medicare Other | Attending: Emergency Medicine | Admitting: Emergency Medicine

## 2021-08-25 ENCOUNTER — Emergency Department: Payer: Medicare Other

## 2021-08-25 DIAGNOSIS — Z79899 Other long term (current) drug therapy: Secondary | ICD-10-CM | POA: Insufficient documentation

## 2021-08-25 DIAGNOSIS — I10 Essential (primary) hypertension: Secondary | ICD-10-CM | POA: Diagnosis not present

## 2021-08-25 DIAGNOSIS — S299XXA Unspecified injury of thorax, initial encounter: Secondary | ICD-10-CM | POA: Diagnosis present

## 2021-08-25 DIAGNOSIS — Z87891 Personal history of nicotine dependence: Secondary | ICD-10-CM | POA: Diagnosis not present

## 2021-08-25 DIAGNOSIS — S2242XA Multiple fractures of ribs, left side, initial encounter for closed fracture: Secondary | ICD-10-CM

## 2021-08-25 DIAGNOSIS — W01198A Fall on same level from slipping, tripping and stumbling with subsequent striking against other object, initial encounter: Secondary | ICD-10-CM | POA: Insufficient documentation

## 2021-08-25 DIAGNOSIS — W19XXXA Unspecified fall, initial encounter: Secondary | ICD-10-CM

## 2021-08-25 MED ORDER — ACETAMINOPHEN 325 MG PO TABS
650.0000 mg | ORAL_TABLET | Freq: Once | ORAL | Status: AC
  Administered 2021-08-25: 650 mg via ORAL
  Filled 2021-08-25: qty 2

## 2021-08-25 MED ORDER — OXYCODONE HCL 5 MG PO TABS
2.5000 mg | ORAL_TABLET | Freq: Once | ORAL | Status: AC
  Administered 2021-08-25: 2.5 mg via ORAL
  Filled 2021-08-25: qty 1

## 2021-08-25 MED ORDER — OXYCODONE HCL 5 MG PO TABS: 2.5000 mg | ORAL_TABLET | Freq: Four times a day (QID) | ORAL | 0 refills | Status: DC | PRN

## 2021-08-25 NOTE — ED Triage Notes (Signed)
Pt arrives [facility] d/t left sided fall at home.  Pt C/O pain in left rib and axillary areas.Pt Alert and HOH.

## 2021-08-25 NOTE — Discharge Instructions (Addendum)
Please take Tylenol routinely every 8 hours for baseline pain.  Please take a half a tablet of oxycodone as needed if pain is severe.  Be sure to use MiraLAX to prevent constipation.  Can take a whole tablet of oxycodone if needed

## 2021-08-25 NOTE — ED Provider Notes (Signed)
[facility] Emergency Department Provider Note   ____________________________________________    I have reviewed the triage vital signs and the nursing notes.   HISTORY  Chief Complaint Fall  History limited by extremely hard of hearing   HPI Mario Aguilar is a 87 y.o. male with history as detailed below who presents after a fall.  Patient reports he lost his balance and fell and struck his left chest.  No other injuries reported, no extremity pain, no head injury.  Not on blood thinners  Past Medical History:  Diagnosis Date   BPH (benign prostatic hypertrophy)    Elevated PSA    HTN (hypertension)    Prostatitis    Recurrent UTI     Patient Active Problem List   Diagnosis Date Noted   Mild protein-calorie malnutrition (HCC) 06/22/2021   Stage 3b chronic kidney disease (HCC) 06/22/2021   Frail elderly 06/22/2021   Bilateral leg edema 09/27/2020   Pain due to onychomycosis of toenails of both feet 12/16/2019   Hearing loss 08/27/2016   Trigger finger of left hand 08/27/2016   Senile purpura (HCC) 08/27/2016   Gait instability 08/27/2016   BPH with obstruction/lower urinary tract symptoms 06/14/2015   Benign essential HTN 01/04/2015   Hyperglycemia 12/07/2013   Supraventricular tachycardia (HCC) 12/07/2013   Heart valve disease 12/07/2013    Past Surgical History:  Procedure Laterality Date   Targis  01/13/2001    Prior to Admission medications   Medication Sig Start Date End Date Taking? Authorizing Provider  oxyCODONE (ROXICODONE) 5 MG immediate release tablet Take 0.5 tablets (2.5 mg total) by mouth every 6 (six) hours as needed for severe pain. 08/25/21 08/25/22 Yes Jene Every, MD  furosemide (LASIX) 40 MG tablet Take by mouth. 12/14/19 12/13/20  [provider]  hydrocortisone 2.5 % cream Apply to chest QD on Tuesday, Thursday, and Saturday Patient not taking: Reported on 06/22/2021 06/26/20   Deirdre Evener, MD   ketoconazole (NIZORAL) 2 % cream APPLY TO CHEST ONCE DAILY ON [facility], AND FRIDAY Patient not taking: Reported on 06/22/2021 01/01/21   Deirdre Evener, MD  loratadine (CLARITIN) 10 MG tablet Take 10 mg by mouth daily as needed for allergies.     [provider]  metoprolol tartrate (LOPRESSOR) 25 MG tablet Take 1 tablet by mouth 2 (two) times daily. 06/28/20   [provider]     Allergies Amoxicillin, Avodart [dutasteride], Diphenhydramine, Hydralazine, Lisinopril, and Amlodipine  Family History  Problem Relation Age of Onset   Diabetes Mother    Heart disease Father    Tuberculosis Father    Alcohol abuse Sister    Cancer Brother        pancreatic   Prostate cancer Neg Hx    Bladder Cancer Neg Hx    Kidney cancer Neg Hx     Social History Social History   Tobacco Use   Smoking status: Former    Types: Pipe    Start date: 08/27/1936    Quit date: 08/27/1956    Years since quitting: 65.0   Smokeless tobacco: Never   Tobacco comments:    smoking cessation materials not required  Vaping Use   Vaping Use: Never used  Substance Use Topics   Alcohol use: No    Alcohol/week: 0.0 standard drinks   Drug use: No    Review of Systems  Constitutional: No dizziness  ENT: No epistaxis  Chest: Left-sided chest wall pain Gastrointestinal: No abdominal  pain.  No nausea, no vomiting.    Musculoskeletal: Negative for back pain.     ____________________________________________   PHYSICAL EXAM:  VITAL SIGNS: ED Triage Vitals  Enc Vitals Group     BP 08/25/21 2002 140/78     Pulse Rate 08/25/21 2002 71     Resp 08/25/21 2002 20     Temp 08/25/21 2002 98.6 F (37 C)     Temp Source 08/25/21 2002 Oral     SpO2 08/25/21 2002 98 %     Weight 08/25/21 2004 61.7 kg (136 lb 0.4 oz)     Height 08/25/21 2004 1.676 m (5\' 6" )     Head Circumference --      Peak Flow --      Pain Score 08/25/21 2253 5     Pain Loc --      Pain Edu? --       Excl. in GC? --      Constitutional: Alert and oriented. No acute distress.  Hard of hearing Eyes: Conjunctivae are normal.  Head: Atraumatic. Nose: No swelling or epistaxis Mouth/Throat: Mucous membranes are moist.   Cardiovascular: Normal rate, regular rhythm.  Tenderness palpation along the left lateral lower chest, no bony normalities palpated Respiratory: Normal respiratory effort.  No retractions.  Musculoskeletal: No vertebral has palpation, normal range of motion of all extremities.  No pain with axial load on both hips. Neurologic:  Normal speech and language. No gross focal neurologic deficits are appreciated.   Skin:  Skin is warm, dry and intact. No rash noted.   ____________________________________________   LABS (all labs ordered are listed, but only abnormal results are displayed)  Labs Reviewed - No data to display ____________________________________________  EKG   ____________________________________________  RADIOLOGY  Rib x-ray reviewed by me demonstrates 2 acute rib fractures ____________________________________________   PROCEDURES  Procedure(s) performed: No  Procedures   Critical Care performed: No ____________________________________________   INITIAL IMPRESSION / ASSESSMENT AND PLAN / ED COURSE  Pertinent labs & imaging results that were available during my care of the patient were reviewed by me and considered in my medical decision making (see chart for details).   Patient presents for fall detailed above, exam is overall reassuring, tenderness to the left lower chest wall, x-ray demonstrates rib fractures as detailed above, discussed with patient's daughters care plan, incentive spirometer provided   ____________________________________________   FINAL CLINICAL IMPRESSION(S) / ED DIAGNOSES  Final diagnoses:  Fall, initial encounter  Closed fracture of multiple ribs of left side, initial encounter      NEW MEDICATIONS  STARTED DURING THIS VISIT:  Discharge Medication List as of 08/25/2021 10:05 PM     START taking these medications   Details  oxyCODONE (ROXICODONE) 5 MG immediate release tablet Take 0.5 tablets (2.5 mg total) by mouth every 6 (six) hours as needed for severe pain., Starting Sat 08/25/2021, Until Sun 08/25/2022 at 2359, Normal         Note:  This document was prepared using Dragon voice recognition software and may include unintentional dictation errors.    2360, MD 08/25/21 2337

## 2021-08-28 ENCOUNTER — Telehealth: Payer: Self-pay

## 2021-08-28 NOTE — Telephone Encounter (Signed)
Pt is scheduled for a ER fu for 08/29/21 with Dr Carlynn Purl       Copied from CRM [id number]. Topic: Appointment Scheduling - Scheduling Inquiry for Clinic >> Aug 28, 2021 10:45 AM Randol Kern wrote: Reason for CRM: Pt's daughter needs an appt for a hosp fu, the patient needs a brace for him since he has two broken ribs. Please advise   Best contact: [phone]

## 2021-08-28 NOTE — Progress Notes (Signed)
Name: Mario Aguilar   MRN: [id number]    DOB: 1913-10-19   Date:08/29/2021       Progress Note  Subjective  Chief Complaint  ER Follow Up- Broken Ribs  HPI  Left Rib Fracture: he fell at home on 12/03 , he was in his bathroom, daughter was in a different room and heard the commotion. She took him to [facility] and he was diagnosed with two rib fractures 9 th and 10 th left side displaced. He was sent home with incentive spirometer, oxycodone. He has a bruise on the area now, pain is under better control. Taking Tylenol now, only took two dosis of oxycodone with fear of increase constipation. He has been breathing without pain. He is not sure how he fell, daughter states he seems weaker on lower legs. Discussed PT to prevent other falls and he agreed  Patient Active Problem List   Diagnosis Date Noted   Mild protein-calorie malnutrition (HCC) 06/22/2021   Stage 3b chronic kidney disease (HCC) 06/22/2021   Frail elderly 06/22/2021   Bilateral leg edema 09/27/2020   Pain due to onychomycosis of toenails of both feet 12/16/2019   Hearing loss 08/27/2016   Trigger finger of left hand 08/27/2016   Senile purpura (HCC) 08/27/2016   Gait instability 08/27/2016   BPH with obstruction/lower urinary tract symptoms 06/14/2015   Benign essential HTN 01/04/2015   Hyperglycemia 12/07/2013   Supraventricular tachycardia (HCC) 12/07/2013   Heart valve disease 12/07/2013    Past Surgical History:  Procedure Laterality Date   Targis  01/13/2001    Family History  Problem Relation Age of Onset   Diabetes Mother    Heart disease Father    Tuberculosis Father    Alcohol abuse Sister    Cancer Brother        pancreatic   Prostate cancer Neg Hx    Bladder Cancer Neg Hx    Kidney cancer Neg Hx     Social History   Tobacco Use   Smoking status: Former    Types: Pipe    Start date: 08/27/1936    Quit date: 08/27/1956    Years since quitting: 65.0   Smokeless tobacco: Never   Tobacco comments:     smoking cessation materials not required  Substance Use Topics   Alcohol use: No    Alcohol/week: 0.0 standard drinks     Current Outpatient Medications:    hydrocortisone 2.5 % cream, Apply to chest QD on Tuesday, Thursday, and Saturday, Disp: 28 g, Rfl: 1   ketoconazole (NIZORAL) 2 % cream, APPLY TO CHEST ONCE DAILY ON MONDAY, WEDNESDAY, AND FRIDAY, Disp: 60 g, Rfl: 0   loratadine (CLARITIN) 10 MG tablet, Take 10 mg by mouth daily as needed for allergies. , Disp: , Rfl:    metoprolol tartrate (LOPRESSOR) 25 MG tablet, Take 1 tablet by mouth 2 (two) times daily., Disp: , Rfl:    oxyCODONE (ROXICODONE) 5 MG immediate release tablet, Take 0.5 tablets (2.5 mg total) by mouth every 6 (six) hours as needed for severe pain., Disp: 20 tablet, Rfl: 0   furosemide (LASIX) 40 MG tablet, Take by mouth., Disp: , Rfl:   Allergies  Allergen Reactions   Amoxicillin Itching   Avodart [Dutasteride]     Caused gynecomastia   Diphenhydramine    Hydralazine Other (See Comments)   Lisinopril Cough   Amlodipine Swelling and Rash    I personally reviewed active problem list, medication list, allergies, family history, social history, health  maintenance with the patient/caregiver today.   ROS  Ten systems reviewed and is negative except as mentioned in HPI   Objective  Vitals:   08/29/21 1350  BP: 132/72  Pulse: 80  Resp: 16  Temp: 98.1 F (36.7 C)  SpO2: 98%  Weight: 136 lb (61.7 kg)  Height: 5\' 6"  (1.676 m)    Body mass index is 21.95 kg/m.  Physical Exam  Constitutional: Patient appears frail/. No distress.  HEENT: head atraumatic, normocephalic, pupils equal and reactive to light, ears hearing aids, neck supple, Cardiovascular: Normal rate, regular rhythm and normal heart sounds.  No murmur heard. No BLE edema. Pulmonary/Chest: Effort normal and breath sounds normal. No respiratory distress. Abdominal: Soft.  There is no tenderness. Muscular skeletal: sitting on wheelchair ,  large bruise on left flank area  Psychiatric: Patient has a normal mood and affect. behavior is normal. Judgment and thought content normal.    PHQ2/9: Depression screen [facility] 2/9 08/29/2021 06/22/2021 09/21/2020 07/12/2019 06/28/2019  Decreased Interest 0 0 0 0 0  Down, Depressed, Hopeless 0 0 0 0 0  PHQ - 2 Score 0 0 0 0 0  Altered sleeping 0 - - 0 0  Tired, decreased energy 0 - - 0 0  Change in appetite 0 - - 0 0  Feeling bad or failure about yourself  0 - - 0 0  Trouble concentrating 0 - - 0 0  Moving slowly or fidgety/restless 0 - - 0 0  Suicidal thoughts 0 - - 0 0  PHQ-9 Score 0 - - 0 0  Difficult doing work/chores - - - Not difficult at all -    phq 9 is negative   Fall Risk: Fall Risk  08/29/2021 06/22/2021 09/21/2020 04/24/2020 07/12/2019  Falls in the past year? 1 0 1 0 1  Number falls in past yr: 0 0 0 0 1  Injury with Fall? 1 0 0 0 0  Comment - - - - -  Risk for fall due to : Impaired balance/gait;Impaired mobility Impaired balance/gait;Impaired mobility Impaired balance/gait;Impaired mobility;History of fall(s) Impaired balance/gait;Impaired mobility -  Risk for fall due to: Comment - - - - -  Follow up Falls prevention discussed Falls prevention discussed Falls prevention discussed - -      Functional Status Survey: Is the patient deaf or have difficulty hearing?: Yes Does the patient have difficulty seeing, even when wearing glasses/contacts?: No Does the patient have difficulty concentrating, remembering, or making decisions?: No Does the patient have difficulty walking or climbing stairs?: Yes Does the patient have difficulty dressing or bathing?: Yes Does the patient have difficulty doing errands alone such as visiting a doctor's office or shopping?: Yes    Assessment & Plan  1. History of recent fall  - Ambulatory referral to Home Health  2. Open fracture of multiple ribs of left side, initial encounter  - Ambulatory referral to Home Health

## 2021-08-29 ENCOUNTER — Encounter: Payer: Self-pay | Admitting: Family Medicine

## 2021-08-29 ENCOUNTER — Ambulatory Visit: Payer: Medicare Other | Admitting: Family Medicine

## 2021-08-29 VITALS — BP 132/72 | HR 80 | Temp 98.1°F | Resp 16 | Ht 66.0 in | Wt 136.0 lb

## 2021-08-29 DIAGNOSIS — Z9181 History of falling: Secondary | ICD-10-CM

## 2021-08-29 DIAGNOSIS — S2242XB Multiple fractures of ribs, left side, initial encounter for open fracture: Secondary | ICD-10-CM | POA: Diagnosis not present

## 2021-08-31 ENCOUNTER — Encounter: Payer: Self-pay | Admitting: Nurse Practitioner

## 2021-08-31 ENCOUNTER — Other Ambulatory Visit: Payer: Self-pay

## 2021-08-31 ENCOUNTER — Telehealth: Payer: Self-pay

## 2021-08-31 ENCOUNTER — Telehealth (INDEPENDENT_AMBULATORY_CARE_PROVIDER_SITE_OTHER): Payer: Medicare Other | Admitting: Nurse Practitioner

## 2021-08-31 DIAGNOSIS — R8290 Unspecified abnormal findings in urine: Secondary | ICD-10-CM

## 2021-08-31 LAB — POCT URINALYSIS DIPSTICK
Bilirubin, UA: NEGATIVE
Glucose, UA: NEGATIVE
Ketones, UA: NEGATIVE
Nitrite, UA: NEGATIVE
Protein, UA: POSITIVE — AB
Spec Grav, UA: 1.02 (ref 1.010–1.025)
Urobilinogen, UA: 0.2 E.U./dL
pH, UA: 5 (ref 5.0–8.0)

## 2021-08-31 MED ORDER — SULFAMETHOXAZOLE-TRIMETHOPRIM 800-160 MG PO TABS: 1.0000 | ORAL_TABLET | Freq: Two times a day (BID) | ORAL | 0 refills | Status: AC

## 2021-08-31 NOTE — Progress Notes (Signed)
Name: Mario Aguilar   MRN: [id number]    DOB: [birth year]   Date:08/31/2021       Progress Note  Subjective  Chief Complaint  Chief Complaint  Patient presents with   Dysuria    Strong odor when urinating    I connected with  [name]  on 08/31/21 at  4:00 PM EST by a video enabled telemedicine application and verified that I am speaking with the correct person using two identifiers.  I discussed the limitations of evaluation and management by telemedicine and the availability of in person appointments. The patient expressed understanding and agreed to proceed with a virtual visit  Staff also discussed with the patient that there may be a patient responsible charge related to this service. Patient Location: home Provider Location: [facility] Additional Individuals present: with Daughter Jasmine December and home health aid  HPI  Abnormal urine odor/urinary frequency: He  has home health aids and they noticed that he had a strong abnormal odor to his urine and urinary frequency.  Denies fever, chills, back pain or dysuria.  He was recently in the hospital for a fall and has had weakness in his legs.  He has two rib fractures. Discussed that his urine results showed protein, blood and leukocytes.  Will start on antibiotic while waiting for culture.  Push fluids. Discussed concerning signs that would require emergency care.    Patient Active Problem List   Diagnosis Date Noted   Mild protein-calorie malnutrition (HCC) 06/22/2021   Stage 3b chronic kidney disease (HCC) 06/22/2021   Frail elderly 06/22/2021   Bilateral leg edema 09/27/2020   Pain due to onychomycosis of toenails of both feet 12/16/2019   Hearing loss 08/27/2016   Trigger finger of left hand 08/27/2016   Senile purpura (HCC) 08/27/2016   Gait instability 08/27/2016   BPH with obstruction/lower urinary tract symptoms 06/14/2015   Benign essential HTN 01/04/2015   Hyperglycemia 12/07/2013   Supraventricular tachycardia (HCC)  12/07/2013   Heart valve disease 12/07/2013    Social History   Tobacco Use   Smoking status: Former    Types: Pipe    Start date: 08/27/1936    Quit date: 08/27/1956    Years since quitting: 65.0   Smokeless tobacco: Never   Tobacco comments:    smoking cessation materials not required  Substance Use Topics   Alcohol use: No    Alcohol/week: 0.0 standard drinks     Current Outpatient Medications:    hydrocortisone 2.5 % cream, Apply to chest QD on Tuesday, Thursday, and Saturday, Disp: 28 g, Rfl: 1   ketoconazole (NIZORAL) 2 % cream, APPLY TO CHEST ONCE DAILY ON MONDAY, WEDNESDAY, AND FRIDAY, Disp: 60 g, Rfl: 0   loratadine (CLARITIN) 10 MG tablet, Take 10 mg by mouth daily as needed for allergies. , Disp: , Rfl:    metoprolol tartrate (LOPRESSOR) 25 MG tablet, Take 1 tablet by mouth 2 (two) times daily., Disp: , Rfl:    oxyCODONE (ROXICODONE) 5 MG immediate release tablet, Take 0.5 tablets (2.5 mg total) by mouth every 6 (six) hours as needed for severe pain., Disp: 20 tablet, Rfl: 0   furosemide (LASIX) 40 MG tablet, Take by mouth., Disp: , Rfl:   Allergies  Allergen Reactions   Amoxicillin Itching   Avodart [Dutasteride]     Caused gynecomastia   Diphenhydramine    Hydralazine Other (See Comments)   Lisinopril Cough   Amlodipine Swelling and Rash    I personally reviewed  active problem list, medication list, allergies, notes from last encounter with the patient/caregiver today.  ROS  Constitutional: Negative for fever or weight change.  Respiratory: Negative for cough and shortness of breath.   Cardiovascular: Negative for chest pain or palpitations.  Gastrointestinal: Negative for abdominal pain, no bowel changes.  Genitourinary: positive for abnormal urine odor Musculoskeletal: Negative for gait problem or joint swelling.  Skin: Negative for rash.  Neurological: Negative for dizziness or headache.  No other specific complaints in a complete review of systems  (except as listed in HPI above).   Objective  Virtual encounter, vitals not obtained.  There is no height or weight on file to calculate BMI.  Nursing Note and Vital Signs reviewed.  Physical Exam  Awake, alert and oriented   Results for orders placed or performed in visit on 08/31/21 (from the past 72 hour(s))  POCT urinalysis dipstick     Status: Abnormal   Collection Time: 08/31/21  2:22 PM  Result Value Ref Range   Color, UA yellow    Clarity, UA cloudy    Glucose, UA Negative Negative   Bilirubin, UA negative    Ketones, UA negative    Spec Grav, UA 1.020 1.010 - 1.025   Blood, UA trace    pH, UA 5.0 5.0 - 8.0   Protein, UA Positive (A) Negative   Urobilinogen, UA 0.2 0.2 or 1.0 E.U./dL   Nitrite, UA negative    Leukocytes, UA Trace (A) Negative   Appearance cloudy    Odor small     Assessment & Plan  1. Abnormal urine odor  - POCT urinalysis dipstick - Urine Culture - sulfamethoxazole-trimethoprim (BACTRIM DS) 800-160 MG tablet; Take 1 tablet by mouth 2 (two) times daily for 5 days.  Dispense: 10 tablet; Refill: 0    -Red flags and when to present for emergency care or RTC including fever >101.47F, chest pain, shortness of breath, new/worsening/un-resolving symptoms, reviewed with patient at time of visit. Follow up and care instructions discussed and provided in AVS. - I discussed the assessment and treatment plan with the patient. The patient was provided an opportunity to ask questions and all were answered. The patient agreed with the plan and demonstrated an understanding of the instructions.  I provided 20 minutes of non-face-to-face time during this encounter.  Berniece Salines, FNP

## 2021-08-31 NOTE — Telephone Encounter (Signed)
Copied from CRM [id number]. Topic: General - Other >> Aug 31, 2021  9:04 AM Traci Sermon wrote: Reason for CRM: Pts daughter called in stating pt has a strong urine smell and believes he may have a UTI and wanted to see about getting a urine cup to take a urine sample and bring it in, pt is unable to physically come in, please advise.

## 2021-09-01 LAB — URINE CULTURE
MICRO NUMBER:: 12738932
Result:: NO GROWTH
SPECIMEN QUALITY:: ADEQUATE

## 2021-09-03 ENCOUNTER — Ambulatory Visit: Payer: Medicare Other | Admitting: Internal Medicine

## 2021-09-03 ENCOUNTER — Other Ambulatory Visit: Payer: Self-pay

## 2021-09-03 ENCOUNTER — Encounter: Payer: Self-pay | Admitting: Internal Medicine

## 2021-09-03 VITALS — BP 136/60 | HR 72 | Temp 98.2°F | Resp 16 | Ht 66.0 in

## 2021-09-03 DIAGNOSIS — G4700 Insomnia, unspecified: Secondary | ICD-10-CM

## 2021-09-03 DIAGNOSIS — B354 Tinea corporis: Secondary | ICD-10-CM | POA: Diagnosis not present

## 2021-09-03 DIAGNOSIS — B372 Candidiasis of skin and nail: Secondary | ICD-10-CM

## 2021-09-03 MED ORDER — NYSTATIN 100000 UNIT/GM EX POWD
1.0000 | Freq: Three times a day (TID) | CUTANEOUS | 0 refills | Status: DC
Start: 2021-09-03 — End: 2021-09-18

## 2021-09-03 MED ORDER — NYSTATIN 100000 UNIT/GM EX CREA: 1.0000 "application " | TOPICAL_CREAM | Freq: Two times a day (BID) | CUTANEOUS | 0 refills | Status: DC

## 2021-09-03 MED ORDER — MELATONIN ER 1 MG PO TBCR: 1.0000 mg | EXTENDED_RELEASE_TABLET | Freq: Every evening | ORAL | 0 refills | Status: AC

## 2021-09-03 NOTE — Progress Notes (Signed)
Acute Office Visit  Subjective:    Patient ID: Mario Aguilar, male    DOB: 05/22/1914, 87 y.o.   MRN: [id number]  Chief Complaint  Patient presents with   Rash    Itchy skin in groin area possible fungal infection    HPI Patient is in today for itchy skin. He is accompanied by her daughter and caretaker. The patient is hard of hearing so his daughter and caregiver both provide the history. They are concerned because the patient has been scratching vigorously at his groin, chest wall and scalp for the last several months but worse recently. They had been using Ketoconazole cream, Hydrocortisone cream and Lamisil on and off.  He has some abrasions on his groin from scratching but no open wounds, no drainage, no bleeding. He does wake up in the middle of the night to change his clothes, which is distressing to his caregivers because they don't want him to fall or hurt himself.   RASH Duration:  months  Location: trunk and groin, scalp Itching: yes Burning: no Redness: yes Oozing: no Scaling: yes, on scalp Blisters: no Painful: no Fevers: no Change in detergents/soaps/personal care products: no Recent illness: no Recent travel:no History of same: yes Context: worse Alleviating factors: nothing Treatments attempted:hydrocortisone cream and lotion/moisturizer   Past Medical History:  Diagnosis Date   BPH (benign prostatic hypertrophy)    Elevated PSA    HTN (hypertension)    Prostatitis    Recurrent UTI     Past Surgical History:  Procedure Laterality Date   [place]  01/13/2001    Family History  Problem Relation Age of Onset   Diabetes Mother    Heart disease Father    Tuberculosis Father    Alcohol abuse Sister    Cancer Brother        pancreatic   Prostate cancer Neg Hx    Bladder Cancer Neg Hx    Kidney cancer Neg Hx     Social History   Socioeconomic History   Marital status: Widowed    Spouse name: [name]   Number of children: 4   Years of  education: Not on file   Highest education level: Bachelor's degree (e.g., BA, AB, BS)  Occupational History   Occupation: Retired  Tobacco Use   Smoking status: Former    Types: Pipe    Start date: 08/27/1936    Quit date: 08/27/1956    Years since quitting: 65.0   Smokeless tobacco: Never   Tobacco comments:    smoking cessation materials not required  Vaping Use   Vaping Use: Never used  Substance and Sexual Activity   Alcohol use: No    Alcohol/week: 0.0 standard drinks   Drug use: No   Sexual activity: Not Currently  Other Topics Concern   Not on file  Social History Narrative   Pt lives alone but has 3 daughters that stay overnight and home instead care giver that comes every day.   Social Determinants of Health   Financial Resource Strain: Low Risk    Difficulty of Paying Living Expenses: Not hard at all  Food Insecurity: No Food Insecurity   Worried About Programme researcher, broadcasting/film/video in the Last Year: Never true   Ran Out of Food in the Last Year: Never true  Transportation Needs: No Transportation Needs   Lack of Transportation (Medical): No   Lack of Transportation (Non-Medical): No  Physical Activity: Inactive   Days of Exercise per Week: 0 days  Minutes of Exercise per Session: 0 min  Stress: No Stress Concern Present   Feeling of Stress : Not at all  Social Connections: Socially Isolated   Frequency of Communication with Friends and Family: Never   Frequency of Social Gatherings with Friends and Family: More than three times a week   Attends Religious Services: Never   Database administrator or Organizations: No   Attends Banker Meetings: Never   Marital Status: Widowed  Intimate Partner Violence: Not At Risk   Fear of Current or Ex-Partner: No   Emotionally Abused: No   Physically Abused: No   Sexually Abused: No    Outpatient Medications Prior to Visit  Medication Sig Dispense Refill   hydrocortisone 2.5 % cream Apply to chest QD on Tuesday,  Thursday, and Saturday 28 g 1   loratadine (CLARITIN) 10 MG tablet Take 10 mg by mouth daily as needed for allergies.      metoprolol tartrate (LOPRESSOR) 25 MG tablet Take 1 tablet by mouth 2 (two) times daily.     sulfamethoxazole-trimethoprim (BACTRIM DS) 800-160 MG tablet Take 1 tablet by mouth 2 (two) times daily for 5 days. 10 tablet 0   furosemide (LASIX) 40 MG tablet Take by mouth.     ketoconazole (NIZORAL) 2 % cream APPLY TO CHEST ONCE DAILY ON MONDAY, WEDNESDAY, AND FRIDAY (Patient not taking: Reported on 09/03/2021) 60 g 0   oxyCODONE (ROXICODONE) 5 MG immediate release tablet Take 0.5 tablets (2.5 mg total) by mouth every 6 (six) hours as needed for severe pain. (Patient not taking: Reported on 09/03/2021) 20 tablet 0   No facility-administered medications prior to visit.    Allergies  Allergen Reactions   Amoxicillin Itching   Avodart [Dutasteride]     Caused gynecomastia   Diphenhydramine    Hydralazine Other (See Comments)   Lisinopril Cough   Amlodipine Swelling and Rash    Review of Systems  Constitutional:  Negative for appetite change, chills and fever.  Skin:  Positive for color change.      Objective:    Physical Exam Constitutional:      Appearance: Normal appearance.     Comments: In a wheelchair  HENT:     Head: Normocephalic and atraumatic.     Comments: HOH Eyes:     Conjunctiva/sclera: Conjunctivae normal.  Cardiovascular:     Rate and Rhythm: Normal rate and regular rhythm.  Pulmonary:     Effort: Pulmonary effort is normal.     Breath sounds: Normal breath sounds.  Abdominal:     General: Bowel sounds are normal. There is no distension.     Palpations: Abdomen is soft.     Tenderness: There is no abdominal tenderness.  Skin:    Comments: Dry skin on scalp, tinea on chest wall with yeast infection in groin  Neurological:     General: No focal deficit present.     Mental Status: He is alert. Mental status is at baseline.  Psychiatric:         Mood and Affect: Mood normal.        Behavior: Behavior normal.    BP 136/60   Pulse 72   Temp 98.2 F (36.8 C)   Resp 16   Ht 5\' 6"  (1.676 m)   SpO2 98%   BMI 21.95 kg/m  Wt Readings from Last 3 Encounters:  08/29/21 136 lb (61.7 kg)  08/25/21 136 lb 0.4 oz (61.7 kg)  06/22/21 136 lb (61.7  kg)    Health Maintenance Due  Topic Date Due   COVID-19 Vaccine (4 - Booster for Pfizer series) 09/06/2020    There are no preventive care reminders to display for this patient.   No results found for: TSH Lab Results  Component Value Date   WBC 5.6 02/21/2019   HGB 14.0 02/21/2019   HCT 41.7 02/21/2019   MCV 87.1 02/21/2019   PLT 202 02/21/2019   Lab Results  Component Value Date   NA 136 02/21/2019   K 4.1 02/21/2019   CO2 21 (L) 02/21/2019   GLUCOSE 101 (H) 02/21/2019   BUN 36 (H) 02/21/2019   CREATININE 1.57 (H) 02/21/2019   BILITOT 0.9 02/21/2019   ALKPHOS 53 02/21/2019   AST 25 02/21/2019   ALT 14 02/21/2019   PROT 6.6 02/21/2019   ALBUMIN 4.1 02/21/2019   CALCIUM 8.4 (L) 02/21/2019   ANIONGAP 11 02/21/2019   No results found for: CHOL No results found for: HDL No results found for: LDLCALC No results found for: TRIG No results found for: CHOLHDL No results found for: TZGY1V     Assessment & Plan:   1. Skin yeast infection/Tinea corporis: Discussed using dandruff shampoo on head with a fine tooth comb and using conditioner to keep the scalp moisturized to avoid dry skin. Nystatin cream for chest wall and powder for groin.   - nystatin (MYCOSTATIN/NYSTOP) powder; Apply 1 application topically 3 (three) times daily.  Dispense: 15 g; Refill: 0 - nystatin cream (MYCOSTATIN); Apply 1 application topically 2 (two) times daily.  Dispense: 30 g; Refill: 0  2. Insomnia, unspecified type: Caregivers worried about him getting up in the night, I do not recommend any sleeping aid which could make him more prone to falls. We will try a lose dosed extended release  melatonin to help him sleep throughout the night.   - Melatonin ER 1 MG TBCR; Take 1 mg by mouth at bedtime.  Dispense: 30 tablet; Refill: 0   Margarita Mail, DO

## 2021-09-03 NOTE — Patient Instructions (Addendum)
It was great seeing you today!  Plan discussed at today's visit: -Use Nystatin cream on chest wall -Use Nystatin powder on groin  -Use Selsun blue shampoo to help with itching as well as a conditioner to keep skin moisturized, use lotion on body after showering as well   Follow up in: as needed.  Take care and let us know if you have any questions or concerns prior to your next visit.  Dr. Caralee Ates

## 2021-09-06 ENCOUNTER — Ambulatory Visit: Payer: Medicare Other | Admitting: Podiatry

## 2021-09-07 ENCOUNTER — Ambulatory Visit: Payer: Self-pay

## 2021-09-07 DIAGNOSIS — E785 Hyperlipidemia, unspecified: Secondary | ICD-10-CM

## 2021-09-07 MED ORDER — ROSUVASTATIN CALCIUM 20 MG PO TABS: 20.0000 mg | ORAL_TABLET | Freq: Every day | ORAL | 3 refills | Status: DC

## 2021-09-07 NOTE — Telephone Encounter (Signed)
°  Chief Complaint: diarrhea Symptoms: weakness, decreased appetite Frequency: several days Pertinent Negatives: NA Disposition: [] ED /[] Urgent Care (no appt availability in office) / [] Appointment(In office/virtual)/ []  [facility]/ [x] Home Care/ [] Refused Recommended Disposition  Additional Notes: Daughter was called back, states she called Hospice but they told her she needs a referral for pt. She states he also has diarrhea and decreased appetite and pt is very weak. Her and her sisters are taking care of pt as well as [facility] and they are wanting to see if pt is candidate for hospice care. Scheduled pt for virtual visit on Monday 09/10/21 at 1140 so they can speak with Dr. about the referral. Gave home care advice for diarrhea and advised to call back if symptoms worsen. She verbalized understanding.    Message from sent at 09/07/2021 11:24 AM EST  Pt daughter is calling concerned that her dad the pt, is not eating, and sleeping more. Pt also has diarrhea. Concerned that the pt should go to hospice care. Please advise    Reason for Disposition  MILD-MODERATE diarrhea (e.g., 1-6 times / day more than normal)  Protocols used: Diarrhea-A-AH

## 2021-09-07 NOTE — Addendum Note (Signed)
Addended by: Margarita Mail on: 09/07/2021 01:14 PM   Modules accepted: Orders

## 2021-09-10 ENCOUNTER — Telehealth (INDEPENDENT_AMBULATORY_CARE_PROVIDER_SITE_OTHER): Payer: Medicare Other | Admitting: Internal Medicine

## 2021-09-10 ENCOUNTER — Telehealth: Payer: Self-pay | Admitting: Family Medicine

## 2021-09-10 ENCOUNTER — Encounter: Payer: Self-pay | Admitting: Internal Medicine

## 2021-09-10 DIAGNOSIS — R630 Anorexia: Secondary | ICD-10-CM | POA: Diagnosis not present

## 2021-09-10 NOTE — Progress Notes (Addendum)
Virtual Visit via Video Note  I connected with [name] on 09/10/21 at 11:40 AM EST by phone and verified that I am speaking with the correct person using two identifiers.  Location: Patient: Home Provider: [facility]   I discussed the limitations of evaluation and management by telemedicine and the availability of in person appointments. The patient expressed understanding and agreed to proceed.  History of Present Illness:  Mario Aguilar is a 87 year old male presenting over the phone for decreased appetite. I spoke to the patient's daughter, Ginger on the phone. She expressed concerns over the patient's gradual decline. He did have a fall at the beginning of the month, resulting in rib pain. She states he was also on an antibiotic at that time, which caused diarrhea. That has resolved, but since then his appetite has been decreased, although it has started increasing lately. She says his appetite now is about half of what it had been prior to the fall. At the time of the fall, an order for home PT was placed to work on strengthening, this has not started yet. He does have a caregiver sometimes throughout the week but it is mainly his 3 daughters who take turns caring for him. He does wake up in the night to change clothes and they are worried he will fall doing this. Diarrhea has resolved but he still appears weak.   Observations/Objective:  Spoke to patient's daughter on the phone  Assessment and Plan:  1. Decreased appetite: Discussed that decreased appetite with age can be normal. We discussed at home hospice vs. Home health aids and what both entitle. The patient's daughter understands and will discuss it with her sisters and will call us back to make a formal referral request.    Follow Up Instructions: PRN    I discussed the assessment and treatment plan with the patient. The patient was provided an opportunity to ask questions and all were answered. The patient agreed with the plan  and demonstrated an understanding of the instructions.   The patient was advised to call back or seek an in-person evaluation if the symptoms worsen or if the condition fails to improve as anticipated.  I provided 25 minutes of non-face-to-face time during this encounter.   Margarita Mail, DO

## 2021-09-10 NOTE — Telephone Encounter (Signed)
Pt's daughter called in to update provider. She has spoken with her sisters and has decided to go forward with the hospice option for pt. Sisters would like to know if this is covered by pt's insurance?    Please assist further.

## 2021-09-11 ENCOUNTER — Other Ambulatory Visit: Payer: Self-pay

## 2021-09-11 DIAGNOSIS — Z9181 History of falling: Secondary | ICD-10-CM

## 2021-09-11 DIAGNOSIS — R2681 Unsteadiness on feet: Secondary | ICD-10-CM

## 2021-09-11 DIAGNOSIS — R531 Weakness: Secondary | ICD-10-CM

## 2021-09-11 DIAGNOSIS — R4181 Age-related cognitive decline: Secondary | ICD-10-CM

## 2021-09-11 DIAGNOSIS — R630 Anorexia: Secondary | ICD-10-CM

## 2021-09-11 NOTE — Telephone Encounter (Signed)
Completed, referral placed, faxed.

## 2021-09-11 NOTE — Telephone Encounter (Signed)
Patient daughter Mario Aguilar called in to speak with Freada Bergeron about being ready to set up with hospice for care for the patient. Asking if Dois Davenport can please call her at Ph# [phone]

## 2021-09-11 NOTE — Telephone Encounter (Signed)
Spoke with daughter, she will let us know when she talks to insurance.

## 2021-09-14 ENCOUNTER — Telehealth: Payer: Self-pay | Admitting: Family Medicine

## 2021-09-14 NOTE — Telephone Encounter (Signed)
Verbal order given to Mitzie.

## 2021-09-14 NOTE — Telephone Encounter (Signed)
Mitzie with [facility] is calling in to request verbal orders for Lorazapam. She says that pt is experiencing aggravation and sleepless nights.   Direction: 0.5 MG tablet - 1 every 4 hours as needed. Pt is also experiencing anxiety.    Mitzie says that she also need the providers DEA.    Phone: [phone]-(secure voicemail)

## 2021-09-18 ENCOUNTER — Other Ambulatory Visit: Payer: Self-pay

## 2021-09-18 ENCOUNTER — Other Ambulatory Visit: Payer: Self-pay | Admitting: Family Medicine

## 2021-09-18 ENCOUNTER — Ambulatory Visit: Payer: Self-pay

## 2021-09-18 DIAGNOSIS — R531 Weakness: Secondary | ICD-10-CM

## 2021-09-18 DIAGNOSIS — E441 Mild protein-calorie malnutrition: Secondary | ICD-10-CM

## 2021-09-18 DIAGNOSIS — R54 Age-related physical debility: Secondary | ICD-10-CM

## 2021-09-18 DIAGNOSIS — S2242XS Multiple fractures of ribs, left side, sequela: Secondary | ICD-10-CM

## 2021-09-18 MED ORDER — OXYCODONE HCL 5 MG PO TABS: 2.5000 mg | ORAL_TABLET | Freq: Four times a day (QID) | ORAL | 0 refills | Status: AC | PRN

## 2021-09-18 NOTE — Telephone Encounter (Signed)
Chief Complaint: Patient on Lasix 40 mg BID (morning and noon) and no potassium supplement ordered.  Symptoms: Frequent urination at night and pain to the left kidney area on the back Frequency: N/A Pertinent Negatives: Patient denies N/A Disposition: [] ED /[] Urgent Care (no appt availability in office) / [] Appointment(In office/virtual)/ []  [facility]/ [] Home Care/ [] Refused Recommended Disposition  Additional Notes: Mitzi, RN with [facility] would like a call back to let her know if Dr. wants them to draw labs to check potassium. She's concerned with the 80 mg lasix the patient takes that maybe his potassium is low, the medication is working the kidney's too hard and that it may be the reason for the kidney pain, since he just finished antibiotics. CB # or [phone].    Summary: medication concerns   Mitzi from [facility] called and asked to speak with a nurse about some concerns she has about the pts meds/ please advise/ Mitzi hung up while holding for NT      Reason for Disposition  [1] Caller requesting NON-URGENT health information AND [2] PCP's office is the best resource  Answer Assessment - Initial Assessment Questions 1. REASON FOR CALL or QUESTION: "What is your reason for calling today?" or "How can I best help you?" or "What question do you have that I can help answer?"     Concerned about patient being on Lasix and no potassium supplement  Protocols used: Information Only Call - No Triage-A-AH

## 2021-09-18 NOTE — Telephone Encounter (Signed)
Mitzy called back needing to discuss the patients medications, please advise.

## 2021-09-22 LAB — BASIC METABOLIC PANEL
BUN: 53 — AB (ref 4–21)
CO2: 21 (ref 13–22)
Chloride: 101 (ref 99–108)
Creatinine: 2.1 — AB (ref 0.6–1.3)
Glucose: 163
Potassium: 4.1 (ref 3.4–5.3)
Sodium: 139 (ref 137–147)

## 2021-09-22 LAB — COMPREHENSIVE METABOLIC PANEL
Calcium: 8.4 — AB (ref 8.7–10.7)
GFR calc non Af Amer: 27

## 2021-09-25 NOTE — Progress Notes (Signed)
Abstracted

## 2021-09-27 ENCOUNTER — Telehealth: Payer: Self-pay

## 2021-09-27 DIAGNOSIS — G4700 Insomnia, unspecified: Secondary | ICD-10-CM

## 2021-09-27 NOTE — Telephone Encounter (Signed)
Copied from CRM [id number]. Topic: General - Other >> Sep 26, 2021  5:03 PM Marylen Ponto wrote: Reason for CRM: Mitzi with [facility] stated pt is not sleeping at night but sleeps throughout the day. Mitzi also stated pt family is giving him oxycodone at night but he is not in pain so she would like to requests Rx for Seroquel 25 MG to be taken at bedtime. Cb# [phone]

## 2021-10-01 ENCOUNTER — Telehealth: Payer: Self-pay

## 2021-10-01 MED ORDER — QUETIAPINE FUMARATE 25 MG PO TABS: 25.0000 mg | ORAL_TABLET | Freq: Every day | ORAL | 3 refills | Status: AC

## 2021-10-01 NOTE — Telephone Encounter (Signed)
[name] called back to inform Katharine Look to please fax form back to fax number where it came from of her brother in law can come and pick it up tomorrow  .

## 2021-10-01 NOTE — Telephone Encounter (Signed)
Called back Mario Aguilar to inform her about rx sent in but no answer, left vm to call back.

## 2021-10-01 NOTE — Telephone Encounter (Signed)
Called Mitzi again, and informed her rx was sent to pharmacy.

## 2021-10-01 NOTE — Addendum Note (Signed)
Addended by: Teodora Medici on: 10/01/2021 02:24 PM   Modules accepted: Orders

## 2021-10-01 NOTE — Telephone Encounter (Signed)
Awaiting return call from Ivin Booty to gather information on where to send completed [facility] forms to.   Awaiting return call from Vivien Rota, [place] with [facility] for patient status update.

## 2021-10-03 NOTE — Telephone Encounter (Signed)
Spoke to [place] and he would like paperwork back-dated 1-year. I told him I would consult with Dr. Carlynn Purl and get back with him. He will fax over new paperwork and we will evaluate.

## 2021-10-03 NOTE — Telephone Encounter (Unsigned)
Copied from CRM [id number]. Topic: General - Other >> Oct 03, 2021  1:12 PM Marylen Ponto wrote: Reason for CRM: Pt son-in-law Dietrich Pates requests call back regarding paperwork that was completed. Vonna Kotyk stated that he has some concerns and would like a call back to discuss. Cb# [phone]

## 2021-10-10 NOTE — Telephone Encounter (Signed)
Cindy with [employer] is calling in to provide [place] with the fax #.   Fax: [phone]

## 2021-10-10 NOTE — Telephone Encounter (Signed)
Got it, thanks!

## 2021-10-15 ENCOUNTER — Telehealth: Payer: Self-pay | Admitting: Family Medicine

## 2021-10-24 DEATH — deceased
